# Patient Record
Sex: Female | Born: 1975 | Race: Black or African American | Hispanic: No | Marital: Single | Smoking: Former smoker
Health system: Southern US, Community
[De-identification: ages and names within clinical notes are randomized; demographics above are authoritative.]

## PROBLEM LIST (undated history)

## (undated) DIAGNOSIS — F329 Major depressive disorder, single episode, unspecified: Secondary | ICD-10-CM

## (undated) DIAGNOSIS — F32A Depression, unspecified: Secondary | ICD-10-CM

## (undated) DIAGNOSIS — F315 Bipolar disorder, current episode depressed, severe, with psychotic features: Secondary | ICD-10-CM

## (undated) DIAGNOSIS — F319 Bipolar disorder, unspecified: Secondary | ICD-10-CM

## (undated) DIAGNOSIS — F419 Anxiety disorder, unspecified: Secondary | ICD-10-CM

## (undated) HISTORY — PX: OTHER SURGICAL HISTORY: SHX169

## (undated) HISTORY — DX: Anxiety disorder, unspecified: F41.9

## (undated) HISTORY — DX: Bipolar disorder, current episode depressed, severe, with psychotic features: F31.5

---

## 2009-01-31 ENCOUNTER — Inpatient Hospital Stay (HOSPITAL_COMMUNITY): Admission: AD | Admit: 2009-01-31 | Discharge: 2009-01-31 | Payer: Self-pay | Admitting: Obstetrics

## 2009-04-03 ENCOUNTER — Ambulatory Visit (HOSPITAL_COMMUNITY): Admission: RE | Admit: 2009-04-03 | Discharge: 2009-04-03 | Payer: Self-pay | Admitting: Obstetrics

## 2009-07-11 ENCOUNTER — Ambulatory Visit (HOSPITAL_COMMUNITY): Admission: RE | Admit: 2009-07-11 | Discharge: 2009-07-11 | Payer: Self-pay | Admitting: Obstetrics

## 2009-08-27 ENCOUNTER — Inpatient Hospital Stay (HOSPITAL_COMMUNITY): Admission: RE | Admit: 2009-08-27 | Discharge: 2009-08-30 | Payer: Self-pay | Admitting: Obstetrics

## 2010-07-22 LAB — CBC
HCT: 34 % — ABNORMAL LOW (ref 36.0–46.0)
HCT: 35.8 % — ABNORMAL LOW (ref 36.0–46.0)
Hemoglobin: 11.2 g/dL — ABNORMAL LOW (ref 12.0–15.0)
Hemoglobin: 12 g/dL (ref 12.0–15.0)
MCHC: 33.3 g/dL (ref 30.0–36.0)
MCHC: 33.6 g/dL (ref 30.0–36.0)
MCV: 85.8 fL (ref 78.0–100.0)
MCV: 86.3 fL (ref 78.0–100.0)
Platelets: 263 10*3/uL (ref 150–400)
RBC: 3.91 MIL/uL (ref 3.87–5.11)
RBC: 3.95 MIL/uL (ref 3.87–5.11)
RBC: 4.17 MIL/uL (ref 3.87–5.11)
RDW: 15.3 % (ref 11.5–15.5)
WBC: 12.1 10*3/uL — ABNORMAL HIGH (ref 4.0–10.5)

## 2010-07-22 LAB — COMPREHENSIVE METABOLIC PANEL
ALT: 19 U/L (ref 0–35)
CO2: 19 mEq/L (ref 19–32)
Calcium: 8.8 mg/dL (ref 8.4–10.5)
GFR calc Af Amer: >60 mL/min (ref >60)
GFR calc non Af Amer: >60 mL/min (ref >60)
Sodium: 134 mEq/L — ABNORMAL LOW (ref 135–145)
Total Protein: 5.7 g/dL — ABNORMAL LOW (ref 6.0–8.3)

## 2010-07-22 LAB — LACTATE DEHYDROGENASE: LDH: 170 U/L (ref 94–250)

## 2010-07-22 LAB — URIC ACID: Uric Acid, Serum: 5.9 mg/dL (ref 2.4–7.0)

## 2010-08-08 LAB — URINALYSIS, ROUTINE W REFLEX MICROSCOPIC
Specific Gravity, Urine: 1.02 (ref 1.005–1.030)
Urobilinogen, UA: 0.2 mg/dL (ref 0.0–1.0)

## 2010-08-08 LAB — WET PREP, GENITAL
Clue Cells Wet Prep HPF POC: NONE SEEN
Trich, Wet Prep: NONE SEEN
Yeast Wet Prep HPF POC: NONE SEEN

## 2010-08-08 LAB — HCG, QUANTITATIVE, PREGNANCY: hCG, Beta Chain, Quant, S: 74665 m[IU]/mL — ABNORMAL HIGH (ref <5)

## 2010-08-08 LAB — CBC
HCT: 33.2 % — ABNORMAL LOW (ref 36.0–46.0)
Hemoglobin: 11.3 g/dL — ABNORMAL LOW (ref 12.0–15.0)
RDW: 16.5 % — ABNORMAL HIGH (ref 11.5–15.5)

## 2010-08-08 LAB — GC/CHLAMYDIA PROBE AMP, GENITAL
Chlamydia, DNA Probe: NEGATIVE
GC Probe Amp, Genital: NEGATIVE

## 2010-08-08 LAB — POCT PREGNANCY, URINE: Preg Test, Ur: POSITIVE

## 2010-08-08 LAB — URINE MICROSCOPIC-ADD ON

## 2010-08-08 LAB — ABO/RH: ABO/RH(D): O POS

## 2010-09-18 ENCOUNTER — Emergency Department (HOSPITAL_COMMUNITY)
Admission: EM | Admit: 2010-09-18 | Discharge: 2010-09-18 | Disposition: A | Discharge status: Home / Self Care | Payer: Medicare Other | Attending: Emergency Medicine | Admitting: Emergency Medicine

## 2010-09-18 DIAGNOSIS — R221 Localized swelling, mass and lump, neck: Secondary | ICD-10-CM | POA: Insufficient documentation

## 2010-09-18 DIAGNOSIS — R21 Rash and other nonspecific skin eruption: Secondary | ICD-10-CM | POA: Insufficient documentation

## 2010-09-18 DIAGNOSIS — R51 Headache: Secondary | ICD-10-CM | POA: Insufficient documentation

## 2010-09-18 DIAGNOSIS — R220 Localized swelling, mass and lump, head: Secondary | ICD-10-CM | POA: Insufficient documentation

## 2010-09-18 DIAGNOSIS — Z79899 Other long term (current) drug therapy: Secondary | ICD-10-CM | POA: Insufficient documentation

## 2011-11-12 ENCOUNTER — Emergency Department (INDEPENDENT_AMBULATORY_CARE_PROVIDER_SITE_OTHER): Payer: Medicare Other

## 2011-11-12 ENCOUNTER — Emergency Department (INDEPENDENT_AMBULATORY_CARE_PROVIDER_SITE_OTHER)
Admission: EM | Admit: 2011-11-12 | Discharge: 2011-11-12 | Disposition: A | Discharge status: Home / Self Care | Payer: Medicare Other | Source: Home / Self Care | Attending: Family Medicine | Admitting: Family Medicine

## 2011-11-12 ENCOUNTER — Encounter (HOSPITAL_COMMUNITY): Payer: Self-pay | Admitting: Emergency Medicine

## 2011-11-12 DIAGNOSIS — J40 Bronchitis, not specified as acute or chronic: Secondary | ICD-10-CM

## 2011-11-12 MED ORDER — AZITHROMYCIN 250 MG PO TABS: 250.0000 mg | ORAL_TABLET | Freq: Every day | ORAL | Status: AC

## 2011-11-12 MED ORDER — GUAIFENESIN-CODEINE 100-10 MG/5ML PO SYRP: ORAL_SOLUTION | ORAL | Status: DC

## 2011-11-12 NOTE — ED Notes (Signed)
Here with progressive productive cough with yellow/green mucous and slight sore throat and chest congestion that started x1 mnth ago unrelieved by otc meds and humidifier.denies fever,chills,n,v

## 2011-11-12 NOTE — ED Provider Notes (Signed)
History     CSN: [id number]  Arrival date & time 11/12/11  1322   First MD Initiated Contact with Patient 11/12/11 1347      Chief Complaint  Patient presents with  . URI    (Consider location/radiation/quality/duration/timing/severity/associated sxs/prior treatment) HPI Comments: The patient reports having a dry cough for over a months. States initially has some cold symptoms. No fever. Most of symptoms cleared but cough is persistent. Some post nasal drainage. No tx pta. Denies hx of asthma or lung problems. No shortness of breath.   The history is provided by the patient.    History reviewed. No pertinent past medical history.  History reviewed. No pertinent past surgical history.  History reviewed. No pertinent family history.  History  Substance Use Topics  . Smoking status: Never Smoker   . Smokeless tobacco: Not on file  . Alcohol Use: Yes    OB History    Grav Para Term Preterm Abortions TAB SAB Ect Mult Living                  Review of Systems  Constitutional: Negative.   HENT: Positive for postnasal drip.   Respiratory: Positive for cough. Negative for shortness of breath and wheezing.   Cardiovascular: Negative.   Gastrointestinal: Negative.   Genitourinary: Negative.   Musculoskeletal: Negative.     Allergies  Review of patient's allergies indicates no known allergies.  Home Medications   Current Outpatient Rx  Name Route Sig Dispense Refill  . LEVONORGEST-ETH ESTRAD 91-DAY 0.15-0.03 MG PO TABS Oral Take 1 tablet by mouth daily.    . SERTRALINE HCL 100 MG PO TABS Oral Take 100 mg by mouth daily.    . TRAZODONE HCL 50 MG PO TABS Oral Take 50 mg by mouth at bedtime.    . AZITHROMYCIN 250 MG PO TABS Oral Take 1 tablet (250 mg total) by mouth daily. Take first 2 tablets together, then 1 every day until finished. 6 tablet 0  . GUAIFENESIN-CODEINE 100-10 MG/5ML PO SYRP  1-2 tsp po q 6 hrs prn cough 120 mL 0    BP 130/77  Pulse 86  Temp 98.6 F  (37 C) (Oral)  Resp 18  SpO2 98%  LMP 09/16/2011  Physical Exam  Nursing note and vitals reviewed. Constitutional: She appears well-developed and well-nourished. No distress.  HENT:  Head: Normocephalic and atraumatic.  Mouth/Throat: Oropharynx is clear and moist.  Neck: Normal range of motion. Neck supple. No thyromegaly present.  Cardiovascular: Normal rate, regular rhythm and normal heart sounds.   Pulmonary/Chest: Effort normal and breath sounds normal.       Congested cough  Lymphadenopathy:    She has no cervical adenopathy.  Skin: Skin is warm and dry.    ED Course  Procedures (including critical care time)  Labs Reviewed - No data to display Dg Chest 2 View  11/12/2011  *RADIOLOGY REPORT*  Clinical Data: Productive cough, congestion  CHEST - 2 VIEW  Comparison: None  Findings: The lungs are clear.  Mediastinal contours appear normal. There is mild peribronchial thickening which may indicate bronchitis.  The heart is within normal limits in size.  No bony abnormality is seen.  IMPRESSION: No pneumonia.  Peribronchial thickening may indicate bronchitis.  Original Report Authenticated By: Juline Patch, M.D.     1. Bronchitis       MDM          Randa Spike, MD 11/12/11 [phone]

## 2011-11-23 ENCOUNTER — Emergency Department (HOSPITAL_COMMUNITY)
Admission: EM | Admit: 2011-11-23 | Discharge: 2011-11-23 | Discharge status: Absent without leave | Payer: Medicare Other | Source: Home / Self Care

## 2011-11-24 ENCOUNTER — Emergency Department (INDEPENDENT_AMBULATORY_CARE_PROVIDER_SITE_OTHER)
Admission: EM | Admit: 2011-11-24 | Discharge: 2011-11-24 | Disposition: A | Discharge status: Home / Self Care | Payer: Medicare Other | Source: Home / Self Care

## 2011-11-24 ENCOUNTER — Encounter (HOSPITAL_COMMUNITY): Payer: Self-pay

## 2011-11-24 DIAGNOSIS — R05 Cough: Secondary | ICD-10-CM

## 2011-11-24 DIAGNOSIS — R0602 Shortness of breath: Secondary | ICD-10-CM

## 2011-11-24 DIAGNOSIS — J40 Bronchitis, not specified as acute or chronic: Secondary | ICD-10-CM

## 2011-11-24 MED ORDER — CETIRIZINE HCL 10 MG PO TABS: 10.0000 mg | ORAL_TABLET | Freq: Every day | ORAL | Status: DC

## 2011-11-24 MED ORDER — ALBUTEROL SULFATE (5 MG/ML) 0.5% IN NEBU
2.5000 mg | INHALATION_SOLUTION | Freq: Once | RESPIRATORY_TRACT | Status: AC
  Administered 2011-11-24: 2.5 mg via RESPIRATORY_TRACT

## 2011-11-24 MED ORDER — ALBUTEROL SULFATE (5 MG/ML) 0.5% IN NEBU
INHALATION_SOLUTION | RESPIRATORY_TRACT | Status: AC
  Filled 2011-11-24: qty 1

## 2011-11-24 MED ORDER — ALBUTEROL SULFATE HFA 108 (90 BASE) MCG/ACT IN AERS: 2.0000 | INHALATION_SPRAY | RESPIRATORY_TRACT | Status: DC | PRN

## 2011-11-24 MED ORDER — IPRATROPIUM BROMIDE 0.02 % IN SOLN
0.5000 mg | Freq: Once | RESPIRATORY_TRACT | Status: AC
  Administered 2011-11-24: 0.5 mg via RESPIRATORY_TRACT

## 2011-11-24 MED ORDER — METHYLPREDNISOLONE SODIUM SUCC 125 MG IJ SOLR
125.0000 mg | Freq: Once | INTRAMUSCULAR | Status: AC
  Administered 2011-11-24: 125 mg via INTRAMUSCULAR

## 2011-11-24 MED ORDER — METHYLPREDNISOLONE SODIUM SUCC 125 MG IJ SOLR
INTRAMUSCULAR | Status: AC
  Filled 2011-11-24: qty 2

## 2011-11-24 MED ORDER — PREDNISONE 50 MG PO TABS: ORAL_TABLET | ORAL | Status: AC

## 2011-11-24 NOTE — ED Provider Notes (Signed)
History     CSN: [id number]  Arrival date & time 11/24/11  1105   None     Chief Complaint  Patient presents with  . Cough    (Consider location/radiation/quality/duration/timing/severity/associated sxs/prior treatment) Patient is a 36 y.o. female presenting with cough. The history is provided by the patient.  Cough  Tami Miller is a 36 y.o. female who complains of intermittent cough for 2 months days. Cough started off as dry then but reports now productive of thick yellow/green phlegm. Cough associated with postnasal drip, shortness of breath and aggravated by deep breath.   She denies a history of asthma or other lung past medical history. Patient does not smoke cigarettes or use any other tobacco products.  Reports she has taken OTC Robitussin with no relief.  Denies fever, chest pain, change in appetite or n/v/d. Seen on 11/12/11 for similar symptoms and was prescribed antibiotics, no change in symptoms.     History reviewed. No pertinent past medical history.  History reviewed. No pertinent past surgical history.  History reviewed. No pertinent family history.  History  Substance Use Topics  . Smoking status: Never Smoker   . Smokeless tobacco: Not on file  . Alcohol Use: Yes    OB History    Grav Para Term Preterm Abortions TAB SAB Ect Mult Living                  Review of Systems  Respiratory: Positive for cough.   All other systems reviewed and are negative.    Allergies  Review of patient's allergies indicates no known allergies.  Home Medications   Current Outpatient Rx  Name Route Sig Dispense Refill  . GUAIFENESIN-CODEINE 100-10 MG/5ML PO SYRP  1-2 tsp po q 6 hrs prn cough 120 mL 0  . LEVONORGEST-ETH ESTRAD 91-DAY 0.15-0.03 MG PO TABS Oral Take 1 tablet by mouth daily.    . SERTRALINE HCL 100 MG PO TABS Oral Take 100 mg by mouth daily.    . TRAZODONE HCL 50 MG PO TABS Oral Take 50 mg by mouth at bedtime.      BP 120/79  Pulse 89  Temp  98.7 F (37.1 C) (Oral)  Resp 18  SpO2 100%  LMP 09/16/2011  Physical Exam  Nursing note and vitals reviewed. Constitutional: She is oriented to person, place, and time. Vital signs are normal. She appears well-developed and well-nourished. She is active and cooperative.  HENT:  Head: Normocephalic.  Right Ear: Hearing, tympanic membrane, external ear and ear canal normal.  Left Ear: Hearing, tympanic membrane, external ear and ear canal normal.  Nose: Nose normal.  Mouth/Throat: Uvula is midline, oropharynx is clear and moist and mucous membranes are normal.  Eyes: Conjunctivae are normal. Pupils are equal, round, and reactive to light. Right eye exhibits no discharge. Left eye exhibits no discharge. No scleral icterus.  Neck: Trachea normal. Neck supple.  Cardiovascular: Normal rate, regular rhythm, normal heart sounds and normal pulses.   Pulmonary/Chest: Effort normal. She has decreased breath sounds in the right lower field, the left middle field and the left lower field. She has wheezes in the right lower field, the left middle field and the left lower field. She has no rhonchi. She has no rales.  Abdominal: Normal appearance and bowel sounds are normal. There is no tenderness.  Neurological: She is alert and oriented to person, place, and time. No cranial nerve deficit or sensory deficit.  Skin: Skin is warm and dry.  Psychiatric: She has a normal mood and affect. Her speech is normal and behavior is normal. Judgment and thought content normal. Cognition and memory are normal.    ED Course  Procedures (including critical care time)  Labs Reviewed - No data to display No results found.   1. Cough   2. SOB (shortness of breath)       MDM  Albuterol 10mg  /Ipratriopium neb, solumedrol 125mg  in office, diminished in bilateral lower fields post neb tx, discussed with pt plan of care, will begin on home oral steroids, SABA and antihistamine.  RTC or seek care for symptoms that  worsen or do not improve.         Johnsie Kindred, NP 11/24/11 1328

## 2011-11-24 NOTE — ED Notes (Signed)
C/o no improvement from prior visit earlier this month; c/o green secretions, productive cough

## 2011-11-24 NOTE — ED Provider Notes (Signed)
Medical screening examination/treatment/procedure(s) were performed by non-physician practitioner and as supervising physician I was immediately available for consultation/collaboration.  Leslee Home, M.D.   Reuben Likes, MD 11/24/11 [phone]

## 2011-11-29 ENCOUNTER — Other Ambulatory Visit: Payer: Self-pay | Admitting: Family Medicine

## 2012-09-30 ENCOUNTER — Encounter (HOSPITAL_COMMUNITY): Payer: Self-pay | Admitting: Emergency Medicine

## 2012-09-30 ENCOUNTER — Emergency Department (HOSPITAL_COMMUNITY)
Admission: EM | Admit: 2012-09-30 | Discharge: 2012-09-30 | Disposition: A | Discharge status: Home / Self Care | Payer: Medicare Other | Source: Home / Self Care | Attending: Family Medicine | Admitting: Family Medicine

## 2012-09-30 DIAGNOSIS — J020 Streptococcal pharyngitis: Secondary | ICD-10-CM

## 2012-09-30 DIAGNOSIS — T169XXA Foreign body in ear, unspecified ear, initial encounter: Secondary | ICD-10-CM

## 2012-09-30 DIAGNOSIS — T162XXA Foreign body in left ear, initial encounter: Secondary | ICD-10-CM

## 2012-09-30 HISTORY — DX: Depression, unspecified: F32.A

## 2012-09-30 HISTORY — DX: Major depressive disorder, single episode, unspecified: F32.9

## 2012-09-30 MED ORDER — FLUCONAZOLE 150 MG PO TABS: 150.0000 mg | ORAL_TABLET | Freq: Once | ORAL | Status: DC

## 2012-09-30 MED ORDER — CEFDINIR 300 MG PO CAPS: 300.0000 mg | ORAL_CAPSULE | Freq: Two times a day (BID) | ORAL | Status: DC

## 2012-09-30 NOTE — ED Notes (Deleted)
Pt is here for a persistent muscle strain... Seen here on 09/30/12 for same reason States that the pain is gradually getting better but feels like he can't go back to work today Works at [employer]... He is alert and oriented w/no signs of acute distress.

## 2012-09-30 NOTE — ED Notes (Signed)
Pt is here for ST onset 2 days... Sx include a dry cough Denies: f/v/n/d.Marland Kitchen She is alert and oriented w/no signs of acute distress.

## 2012-09-30 NOTE — ED Provider Notes (Signed)
History     CSN: [id number]  Arrival date & time 09/30/12  1011   First MD Initiated Contact with Patient 09/30/12 1033      Chief Complaint  Patient presents with  . Sore Throat    (Consider location/radiation/quality/duration/timing/severity/associated sxs/prior treatment) Patient is a 37 y.o. female presenting with pharyngitis.  Sore Throat This is a new problem. The current episode started 2 days ago (son with strep throat.). The problem has not changed since onset.The symptoms are aggravated by swallowing.    Past Medical History  Diagnosis Date  . Depression     History reviewed. No pertinent past surgical history.  No family history on file.  History  Substance Use Topics  . Smoking status: Never Smoker   . Smokeless tobacco: Not on file  . Alcohol Use: Yes    OB History   Grav Para Term Preterm Abortions TAB SAB Ect Mult Living                  Review of Systems  Constitutional: Negative.   HENT: Positive for sore throat, rhinorrhea and postnasal drip.   Respiratory: Positive for cough.     Allergies  Review of patient's allergies indicates no known allergies.  Home Medications   Current Outpatient Rx  Name  Route  Sig  Dispense  Refill  . albuterol (PROVENTIL HFA;VENTOLIN HFA) 108 (90 BASE) MCG/ACT inhaler   Inhalation   Inhale 2 puffs into the lungs every 4 (four) hours as needed for wheezing or shortness of breath.   1 Inhaler   2   . cefdinir (OMNICEF) 300 MG capsule   Oral   Take 1 capsule (300 mg total) by mouth 2 (two) times daily.   20 capsule   0   . cetirizine (ZYRTEC) 10 MG tablet   Oral   Take 1 tablet (10 mg total) by mouth daily.   30 tablet   1   . fluconazole (DIFLUCAN) 150 MG tablet   Oral   Take 1 tablet (150 mg total) by mouth once.   1 tablet   1   . guaiFENesin-codeine (ROBITUSSIN AC) 100-10 MG/5ML syrup      1-2 tsp po q 6 hrs prn cough   120 mL   0   . levonorgestrel-ethinyl estradiol  (SEASONALE,INTROVALE,JOLESSA) 0.15-0.03 MG tablet   Oral   Take 1 tablet by mouth daily.         . sertraline (ZOLOFT) 100 MG tablet   Oral   Take 100 mg by mouth daily.         . traZODone (DESYREL) 50 MG tablet   Oral   Take 50 mg by mouth at bedtime.           BP 119/76  Pulse 74  Temp(Src) 97.8 F (36.6 C) (Oral)  Resp 22  SpO2 97%  LMP 09/29/2012  Physical Exam  Nursing note and vitals reviewed. Constitutional: She is oriented to person, place, and time. She appears well-developed and well-nourished.  HENT:  Head: Normocephalic.  Right Ear: External ear normal.  Mouth/Throat: Oropharynx is clear and moist. No oropharyngeal exudate.  Clear fb in left canal.  Eyes: Pupils are equal, round, and reactive to light.  Neck: Normal range of motion. Neck supple.  Pulmonary/Chest: Breath sounds normal.  Lymphadenopathy:    She has no cervical adenopathy.  Neurological: She is alert and oriented to person, place, and time.  Skin: Skin is warm and dry.    ED Course  Procedures (including critical care time)  Labs Reviewed - No data to display No results found.   1. Strep sore throat   2. Foreign body in ear, left, initial encounter       MDM  Left ear irrigation plastic bead fb removed, canal clear after irrig., tm nl.       Linna Hoff, MD 09/30/12 [phone]

## 2013-04-26 DIAGNOSIS — R0789 Other chest pain: Secondary | ICD-10-CM | POA: Insufficient documentation

## 2013-04-26 DIAGNOSIS — Z79899 Other long term (current) drug therapy: Secondary | ICD-10-CM | POA: Insufficient documentation

## 2013-04-26 DIAGNOSIS — F329 Major depressive disorder, single episode, unspecified: Secondary | ICD-10-CM | POA: Insufficient documentation

## 2013-04-26 DIAGNOSIS — R609 Edema, unspecified: Secondary | ICD-10-CM | POA: Insufficient documentation

## 2013-04-26 DIAGNOSIS — F3289 Other specified depressive episodes: Secondary | ICD-10-CM | POA: Insufficient documentation

## 2013-04-26 DIAGNOSIS — M549 Dorsalgia, unspecified: Secondary | ICD-10-CM | POA: Insufficient documentation

## 2013-04-27 ENCOUNTER — Emergency Department (HOSPITAL_COMMUNITY)
Admission: EM | Admit: 2013-04-27 | Discharge: 2013-04-27 | Disposition: A | Discharge status: Home / Self Care | Payer: Medicare Other | Attending: Emergency Medicine | Admitting: Emergency Medicine

## 2013-04-27 ENCOUNTER — Encounter (HOSPITAL_COMMUNITY): Payer: Self-pay | Admitting: Emergency Medicine

## 2013-04-27 ENCOUNTER — Emergency Department (HOSPITAL_COMMUNITY): Payer: Medicare Other

## 2013-04-27 DIAGNOSIS — R079 Chest pain, unspecified: Secondary | ICD-10-CM

## 2013-04-27 LAB — COMPREHENSIVE METABOLIC PANEL
ALT: 15 U/L (ref 0–35)
AST: 20 U/L (ref 0–37)
Albumin: 3.6 g/dL (ref 3.5–5.2)
Alkaline Phosphatase: 77 U/L (ref 39–117)
CO2: 26 mEq/L (ref 19–32)
Chloride: 102 mEq/L (ref 96–112)
Creatinine, Ser: 0.7 mg/dL (ref 0.50–1.10)
GFR calc non Af Amer: >90 mL/min (ref >90)
Potassium: 3.7 mEq/L (ref 3.5–5.1)
Sodium: 136 mEq/L (ref 135–145)
Total Bilirubin: 0.3 mg/dL (ref 0.3–1.2)

## 2013-04-27 LAB — CBC WITH DIFFERENTIAL/PLATELET
Basophils Absolute: 0 10*3/uL (ref 0.0–0.1)
HCT: 36.8 % (ref 36.0–46.0)
Hemoglobin: 12.3 g/dL (ref 12.0–15.0)
Lymphocytes Relative: 32 % (ref 12–46)
Monocytes Absolute: 0.7 10*3/uL (ref 0.1–1.0)
Neutro Abs: 3.7 10*3/uL (ref 1.7–7.7)
Neutrophils Relative %: 55 % (ref 43–77)
RDW: 14.4 % (ref 11.5–15.5)
WBC: 6.7 10*3/uL (ref 4.0–10.5)

## 2013-04-27 MED ORDER — DIAZEPAM 5 MG/ML IJ SOLN
5.0000 mg | Freq: Once | INTRAMUSCULAR | Status: AC
  Administered 2013-04-27: 5 mg via INTRAVENOUS
  Filled 2013-04-27: qty 2

## 2013-04-27 MED ORDER — SODIUM CHLORIDE 0.9 % IV SOLN
Freq: Once | INTRAVENOUS | Status: AC
  Administered 2013-04-27: 02:00:00 via INTRAVENOUS

## 2013-04-27 MED ORDER — MORPHINE SULFATE 4 MG/ML IJ SOLN
4.0000 mg | Freq: Once | INTRAMUSCULAR | Status: AC
  Administered 2013-04-27: 4 mg via INTRAVENOUS
  Filled 2013-04-27: qty 1

## 2013-04-27 MED ORDER — ONDANSETRON HCL 4 MG/2ML IJ SOLN
4.0000 mg | Freq: Once | INTRAMUSCULAR | Status: AC
  Administered 2013-04-27: 4 mg via INTRAVENOUS
  Filled 2013-04-27: qty 2

## 2013-04-27 MED ORDER — DIAZEPAM 5 MG PO TABS: 5.0000 mg | ORAL_TABLET | Freq: Two times a day (BID) | ORAL | Status: DC

## 2013-04-27 MED ORDER — NAPROXEN 375 MG PO TABS: 375.0000 mg | ORAL_TABLET | Freq: Two times a day (BID) | ORAL | Status: DC

## 2013-04-27 NOTE — ED Provider Notes (Signed)
Medical screening examination/treatment/procedure(s) were performed by non-physician practitioner and as supervising physician I was immediately available for consultation/collaboration.  EKG Interpretation   None        Date: 04/27/2013  Rate: 71  Rhythm: normal sinus rhythm  QRS Axis: normal  Intervals: normal  ST/T Wave abnormalities: normal  Conduction Disutrbances:none  Narrative Interpretation:   Old EKG Reviewed: none available    Olivia Mackie, MD 04/27/13 [phone]

## 2013-04-27 NOTE — ED Notes (Signed)
Gail NP at bedside 

## 2013-04-27 NOTE — ED Notes (Signed)
Pt reports generalized sharp/achy CP that started Sunday, radiating to left side of back. Pt reports increased pain with inspiration. Denies N/V/D/SOB. Lungs sounds clear.

## 2013-04-27 NOTE — ED Provider Notes (Signed)
CSN: [id number]     Arrival date & time 04/26/13  2339 History   First MD Initiated Contact with Patient 04/27/13 0153     Chief Complaint  Patient presents with  . Chest Pain   (Consider location/radiation/quality/duration/timing/severity/associated sxs/prior Treatment) HPI Comments: Patient presents with 4 days of sharp, stabbing pain that starts under her left scapula and radiates to her substernal area, without diaphoresis, nausea, it's worse with inspiration or movement.  She has been trying to rest, without any results She does have an Implanon in place, but she is not a smoker.  She has not traveled.  She has no history of DVT.  She is not short of, breath.  She's not had any cough.  She's had no leg swelling.  No history of pulmonary embolus.  No cardiac history. 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  She denies any known trauma  Patient is a 37 y.o. female presenting with chest pain. The history is provided by the patient.  Chest Pain Pain location:  Unable to specify Pain quality: stabbing   Pain radiates to:  Precordial region Pain severity:  Severe Onset quality:  Unable to specify Duration:  4 days Timing:  Constant Progression:  Unchanged Chronicity:  New Ineffective treatments:  Rest Associated symptoms: back pain   Associated symptoms: no cough, no diaphoresis, no dizziness, no nausea, no numbness, no shortness of breath and no weakness     Past Medical History  Diagnosis Date  . Depression    History reviewed. No pertinent past surgical history. No family history on file. History  Substance Use Topics  . Smoking status: Never Smoker   . Smokeless tobacco: Not on file  . Alcohol Use: Yes   OB History   Grav Para Term Preterm Abortions TAB SAB Ect Mult Living                 Review of Systems  Constitutional: Negative for diaphoresis.  Respiratory: Negative for cough, shortness  of breath and wheezing.   Cardiovascular: Positive for chest pain. Negative for leg swelling.  Gastrointestinal: Negative for nausea.  Musculoskeletal: Positive for back pain. Negative for myalgias.  Neurological: Negative for dizziness, weakness and numbness.    Allergies  Review of patient's allergies indicates no known allergies.  Home Medications   Current Outpatient Rx  Name  Route  Sig  Dispense  Refill  . acetaminophen (TYLENOL) 500 MG tablet   Oral   Take 1,000 mg by mouth every 6 (six) hours as needed for mild pain or moderate pain.         . Multiple Vitamin (MULTIVITAMIN) tablet   Oral   Take 2 tablets by mouth daily.         . sertraline (ZOLOFT) 100 MG tablet   Oral   Take 100 mg by mouth daily.         . traZODone (DESYREL) 50 MG tablet   Oral   Take 50 mg by mouth at bedtime.         Marland Kitchen EXPIRED: albuterol (PROVENTIL HFA;VENTOLIN HFA) 108 (90 BASE) MCG/ACT inhaler   Inhalation   Inhale 2 puffs into the lungs every 4 (four) hours as needed for wheezing or shortness of breath.   1 Inhaler   2   . EXPIRED: cetirizine (ZYRTEC) 10 MG tablet   Oral   Take 1 tablet (10 mg total) by mouth daily.  30 tablet   1   . diazepam (VALIUM) 5 MG tablet   Oral   Take 1 tablet (5 mg total) by mouth 2 (two) times daily.   10 tablet   0   . naproxen (NAPROSYN) 375 MG tablet   Oral   Take 1 tablet (375 mg total) by mouth 2 (two) times daily with a meal.   30 tablet   0    BP 110/73  Pulse 81  Temp(Src) 97.7 F (36.5 C) (Oral)  Resp 16  SpO2 100%  LMP 02/25/2013 Physical Exam  Nursing note and vitals reviewed. Constitutional: She is oriented to person, place, and time. She appears well-developed and well-nourished.  HENT:  Head: Normocephalic.  Mouth/Throat: Oropharynx is clear and moist.  Eyes: Pupils are equal, round, and reactive to light.  Cardiovascular: Normal rate.   Pulmonary/Chest: Effort normal and breath sounds normal.   She exhibits  tenderness.  Abdominal: Soft. Bowel sounds are normal.  Musculoskeletal: Normal range of motion. She exhibits edema.  Neurological: She is alert and oriented to person, place, and time.  Skin: Skin is warm. No rash noted. No pallor.    ED Course  Procedures (including critical care time) Labs Review Labs Reviewed  CBC WITH DIFFERENTIAL  COMPREHENSIVE METABOLIC PANEL  LIPASE, BLOOD  D-DIMER, QUANTITATIVE  POCT I-STAT TROPONIN I   Imaging Review Dg Chest 2 View  04/27/2013   CLINICAL DATA:  Left-sided back and chest pain.  EXAM: CHEST  2 VIEW  COMPARISON:  Chest radiograph performed 11/12/2011  FINDINGS: The lungs are well-aerated. Mild vascular congestion is noted. There is no evidence of focal opacification, pleural effusion or pneumothorax.  The heart is normal in size; the mediastinal contour is within normal limits. No acute osseous abnormalities are seen.  IMPRESSION: Mild vascular congestion noted; lungs remain grossly clear.   Electronically Signed   By: Roanna Raider M.D.   On: 04/27/2013 04:21    EKG Interpretation   None       MDM   1. Chest pain at rest     Patient's EKG, and troponin are normal.  She has a negative d-dimer, chest x-ray is normal.  Patient did receive significant relief from Valium, and morphine.  She will be discharged him with by mouth Valium, and Naprosyn to take on a regular basis Do not feel this is acute coronary syndrome, nor pulmonary emboli    Arman Filter, NP 04/27/13 [phone]

## 2013-06-30 ENCOUNTER — Other Ambulatory Visit: Payer: Self-pay | Admitting: Family

## 2013-06-30 DIAGNOSIS — T8339XA Other mechanical complication of intrauterine contraceptive device, initial encounter: Secondary | ICD-10-CM

## 2013-07-18 ENCOUNTER — Ambulatory Visit
Admission: RE | Admit: 2013-07-18 | Discharge: 2013-07-18 | Disposition: A | Discharge status: Home / Self Care | Payer: Medicare Other | Source: Ambulatory Visit | Attending: Family | Admitting: Family

## 2013-07-18 DIAGNOSIS — T8339XA Other mechanical complication of intrauterine contraceptive device, initial encounter: Secondary | ICD-10-CM

## 2016-02-13 ENCOUNTER — Other Ambulatory Visit: Payer: Self-pay | Admitting: Specialist

## 2016-02-13 DIAGNOSIS — R928 Other abnormal and inconclusive findings on diagnostic imaging of breast: Secondary | ICD-10-CM

## 2016-02-18 ENCOUNTER — Encounter (HOSPITAL_COMMUNITY): Payer: Self-pay | Admitting: *Deleted

## 2016-02-18 ENCOUNTER — Emergency Department (HOSPITAL_COMMUNITY)
Admission: EM | Admit: 2016-02-18 | Discharge: 2016-02-19 | Disposition: A | Discharge status: Home / Self Care | Payer: Medicaid Other | Attending: Emergency Medicine | Admitting: Emergency Medicine

## 2016-02-18 DIAGNOSIS — Z5181 Encounter for therapeutic drug level monitoring: Secondary | ICD-10-CM | POA: Insufficient documentation

## 2016-02-18 DIAGNOSIS — F17200 Nicotine dependence, unspecified, uncomplicated: Secondary | ICD-10-CM | POA: Insufficient documentation

## 2016-02-18 DIAGNOSIS — F333 Major depressive disorder, recurrent, severe with psychotic symptoms: Secondary | ICD-10-CM | POA: Insufficient documentation

## 2016-02-18 DIAGNOSIS — R45851 Suicidal ideations: Secondary | ICD-10-CM

## 2016-02-18 DIAGNOSIS — F1010 Alcohol abuse, uncomplicated: Secondary | ICD-10-CM | POA: Insufficient documentation

## 2016-02-18 DIAGNOSIS — F1210 Cannabis abuse, uncomplicated: Secondary | ICD-10-CM | POA: Insufficient documentation

## 2016-02-18 HISTORY — DX: Bipolar disorder, unspecified: F31.9

## 2016-02-18 LAB — CBC
HCT: 40.4 % (ref 36.0–46.0)
HEMOGLOBIN: 14.1 g/dL (ref 12.0–15.0)
MCH: 31.3 pg (ref 26.0–34.0)
MCHC: 34.9 g/dL (ref 30.0–36.0)
MCV: 89.6 fL (ref 78.0–100.0)
PLATELETS: 273 10*3/uL (ref 150–400)
RBC: 4.51 MIL/uL (ref 3.87–5.11)
RDW: 13.6 % (ref 11.5–15.5)
WBC: 7.3 10*3/uL (ref 4.0–10.5)

## 2016-02-18 LAB — COMPREHENSIVE METABOLIC PANEL
ALK PHOS: 62 U/L (ref 38–126)
ALT: 14 U/L (ref 14–54)
AST: 21 U/L (ref 15–41)
Albumin: 4.3 g/dL (ref 3.5–5.0)
Anion gap: 7 (ref 5–15)
BILIRUBIN TOTAL: 0.3 mg/dL (ref 0.3–1.2)
BUN: 11 mg/dL (ref 6–20)
CALCIUM: 8.9 mg/dL (ref 8.9–10.3)
CHLORIDE: 106 mmol/L (ref 101–111)
CO2: 27 mmol/L (ref 22–32)
CREATININE: 0.77 mg/dL (ref 0.44–1.00)
GFR calc Af Amer: >60 mL/min (ref >60)
Glucose, Bld: 94 mg/dL (ref 65–99)
Potassium: 3.4 mmol/L — ABNORMAL LOW (ref 3.5–5.1)
Sodium: 140 mmol/L (ref 135–145)
Total Protein: 7.7 g/dL (ref 6.5–8.1)

## 2016-02-18 LAB — RAPID URINE DRUG SCREEN, HOSP PERFORMED
AMPHETAMINES: NOT DETECTED
Barbiturates: NOT DETECTED
Benzodiazepines: NOT DETECTED
Cocaine: NOT DETECTED
Opiates: NOT DETECTED
TETRAHYDROCANNABINOL: POSITIVE — AB

## 2016-02-18 LAB — SALICYLATE LEVEL: Salicylate Lvl: <7 mg/dL (ref 2.8–30.0)

## 2016-02-18 LAB — I-STAT BETA HCG BLOOD, ED (MC, WL, AP ONLY)

## 2016-02-18 LAB — ETHANOL: ALCOHOL ETHYL (B): 8 mg/dL — AB (ref <5)

## 2016-02-18 LAB — ACETAMINOPHEN LEVEL: Acetaminophen (Tylenol), Serum: <10 ug/mL — ABNORMAL LOW (ref 10–30)

## 2016-02-18 MED ORDER — ALUM & MAG HYDROXIDE-SIMETH 200-200-20 MG/5ML PO SUSP: 30.0000 mL | ORAL | Status: DC | PRN

## 2016-02-18 MED ORDER — LURASIDONE HCL 20 MG PO TABS
60.0000 mg | ORAL_TABLET | Freq: Every day | ORAL | Status: DC
  Administered 2016-02-18: 60 mg via ORAL
  Filled 2016-02-18: qty 3

## 2016-02-18 MED ORDER — ACETAMINOPHEN 325 MG PO TABS
650.0000 mg | ORAL_TABLET | ORAL | Status: DC | PRN
  Administered 2016-02-19: 650 mg via ORAL
  Filled 2016-02-18: qty 2

## 2016-02-18 MED ORDER — LAMOTRIGINE 100 MG PO TABS
200.0000 mg | ORAL_TABLET | Freq: Every day | ORAL | Status: DC
Start: 2016-02-18 — End: 2016-02-19
  Administered 2016-02-18: 200 mg via ORAL
  Filled 2016-02-18: qty 2

## 2016-02-18 MED ORDER — ADULT MULTIVITAMIN W/MINERALS CH
2.0000 | ORAL_TABLET | Freq: Every day | ORAL | Status: DC
  Administered 2016-02-18: 2 via ORAL
  Filled 2016-02-18: qty 2

## 2016-02-18 MED ORDER — ONDANSETRON HCL 4 MG PO TABS: 4.0000 mg | ORAL_TABLET | Freq: Three times a day (TID) | ORAL | Status: DC | PRN

## 2016-02-18 MED ORDER — VENLAFAXINE HCL 75 MG PO TABS
75.0000 mg | ORAL_TABLET | Freq: Two times a day (BID) | ORAL | Status: DC
  Administered 2016-02-18: 75 mg via ORAL
  Filled 2016-02-18 (×2): qty 1

## 2016-02-18 MED ORDER — IBUPROFEN 200 MG PO TABS: 600.0000 mg | ORAL_TABLET | Freq: Three times a day (TID) | ORAL | Status: DC | PRN

## 2016-02-18 MED ORDER — ZOLPIDEM TARTRATE 5 MG PO TABS: 5.0000 mg | ORAL_TABLET | Freq: Every evening | ORAL | Status: DC | PRN

## 2016-02-18 MED ORDER — ACETAMINOPHEN 500 MG PO TABS: 1000.0000 mg | ORAL_TABLET | Freq: Four times a day (QID) | ORAL | Status: DC | PRN

## 2016-02-18 MED ORDER — BREXPIPRAZOLE 1 MG PO TABS
1.0000 mg | ORAL_TABLET | Freq: Every day | ORAL | Status: DC
Start: 2016-02-18 — End: 2016-02-19
  Administered 2016-02-18: 1 mg via ORAL
  Filled 2016-02-18: qty 1

## 2016-02-18 NOTE — ED Notes (Signed)
Pt awakened feeling much better.  She is compliant with her meds.  Pleasant and cooperative.

## 2016-02-18 NOTE — ED Notes (Signed)
Bed: WA26 Expected date:  Expected time:  Means of arrival:  Comments: Triage 3 

## 2016-02-18 NOTE — ED Triage Notes (Addendum)
Patient presents with her mother with hx of bipolar disorder and depression.  Mother reports that patient seems to be talking to people that she cannot see and hasn't been eating.  She has made comments regarding "leaving this earth and isn't worth being here."  Mother states patient has attempted to OD on her medications, but mother took them away.  Patient is pacing in triage, sat down in the floor and is constantly vocalizing/humming.  Patient has hx of previous suicide attempts per mother.  [place] was hospitalized "years back" in [place].  Mother also believes patient drinks wine every day.    Patient is anxious in triage, but is responding to questions.

## 2016-02-18 NOTE — BH Assessment (Addendum)
Assessment Note  Tami Miller is an 40 y.o. female with history of Bipolar Affective Disorder. She presents to [facility] brought by mother. Patient reports symptom of insomnia. Sts she has not slept in several days. Patient drowsy at the time of the assessment. She reports symptoms of depression evidenced by loss of interest in usual pleasures, withdrawn, crying spells, fatigue, and hopelessness. Patient with increased vegetative symptoms consisting of staying in the bed most of the day and poor grooming. She has suicidal thoughts with plan overdose. Patient has overdosed in the past approximately 3x's. Previous triggers were related to insomnia as well. Patient is not forthcoming about her triggers stating, "Everything is a trigger". Patient has a family history of Bipolar Disorder (mother). No HI. No history of aggressive or assaultive behaviors. No legal issues. Patient admits to auditory hallucinations. She was reluctant to explain details about her auditory hallucinations stating, "They tell me bad things". She reports visual hallucinations of "black spots". Patient has a history of INPT mental health treatment 3x's in the state of [place]. She is currently seeking treatment at [facility] Care. Patient's psychiatrist is Dr. Caroll Rancher. Sts her therapist is  "Cassandra". Patient reports regular use of alcohol and THC use.    Diagnosis: Bipolar Affective Disorder; Depressive Disorder, Recurrent, Severe with psychotic features; Alcohol Use Disorder; Cannibas Use Disorder   Past Medical History:  Past Medical History:  Diagnosis Date  . Bipolar affective disorder (HCC)   . Depression     History reviewed. No pertinent surgical history.   Family History: No family history on file.  Social History:  reports that she has been smoking.  She has never used smokeless tobacco. She reports that she drinks alcohol. She reports that she does not use drugs.  Additional Social History:   Alcohol / Drug Use Pain Medications: SEE MAR Prescriptions: SEE MAR Over the Counter: SEE MAR History of alcohol / drug use?: Yes Substance #1 Name of Substance 1: Alcohol  1 - Age of First Use: "I don't know" 1 - Amount (size/oz): "I don't know" 1 - Frequency: daily  1 - Duration: on-going; "several yrs" 1 - Last Use / Amount: 02/17/2016 Substance #2 Name of Substance 2: THC 2 - Age of First Use: "I don't know" 2 - Amount (size/oz): "I don't know" 2 - Frequency: daily  2 - Duration: on-going  2 - Last Use / Amount: 02/17/2016  CIWA: CIWA-Ar BP: (!) 141/108 Pulse Rate: 94 COWS:    Allergies: No Known Allergies  Home Medications:  (Not in a hospital admission)  OB/GYN Status:  No LMP recorded. Patient is not currently having periods (Reason: IUD).  General Assessment Data [facility] of [facility]: [facility] TTS Assessment: In system Is this a Tele or Face-to-Face Assessment?: Face-to-Face Is this an Initial Assessment or a Re-assessment for this encounter?: Initial Assessment Marital status: Single Maiden name:  (n/a) Is patient pregnant?: No Pregnancy Status: No Living Arrangements: Alone Can pt return to current living arrangement?: No Admission Status: Voluntary Is patient capable of signing voluntary admission?: Yes Referral Source: Self/Family/Friend Insurance type:  [facility])     Crisis Care Plan Living Arrangements: Alone Legal Guardian: Other: (no legal guardian ) Name of Psychiatrist:  (Dr. Caroll Rancher @ [address] of Care) Name of Therapist:  Elonda Husky....something.Marland KitchenMarland KitchenI can't remember her name")  Education Status Is patient currently in school?: No Current Grade:  (n/a) Highest grade of school patient has completed:  (some college) Name of school:  (n/a) Solicitor  person:  (n/a)  Risk to self with the past 6 months Suicidal Ideation: Yes-Currently Present Has patient been a risk to self within the past 6 months prior to admission? : Yes Suicidal  Intent: Yes-Currently Present Has patient had any suicidal intent within the past 6 months prior to admission? : Yes Is patient at risk for suicide?: Yes Suicidal Plan?: Yes-Currently Present Has patient had any suicidal plan within the past 6 months prior to admission? : Yes Specify Current Suicidal Plan:  (overdose) Access to Means: Yes Specify Access to Suicidal Means:  (access to medications) What has been your use of drugs/alcohol within the last 12 months?:  (alcohol and thc ) Previous Attempts/Gestures: Yes How many times?:  (2 to 3 times in the past-overdose) Other Self Harm Risks:  (denies ) Triggers for Past Attempts: Other (Comment) (depression, anxiety, AVH's, Bipolar Disorder, "Everything") Intentional Self Injurious Behavior: None Family Suicide History: Yes (Mother-Bipolar Disorder) Recent stressful life event(s): Other (Comment) ("Everything"; patient would not provider any specific answer) Persecutory voices/beliefs?: No Depression: Yes Depression Symptoms: Feeling worthless/self pity, Feeling angry/irritable, Loss of interest in usual pleasures, Fatigue, Guilt, Isolating, Tearfulness, Insomnia, Despondent Substance abuse history and/or treatment for substance abuse?: No Suicide prevention information given to non-admitted patients: Not applicable  Risk to Others within the past 6 months Homicidal Ideation: No Does patient have any lifetime risk of violence toward others beyond the six months prior to admission? : No Thoughts of Harm to Others: No Current Homicidal Intent: No Current Homicidal Plan: No Access to Homicidal Means: No Identified Victim:  (n/a) History of harm to others?: No Assessment of Violence: None Noted Violent Behavior Description:  (patient calm and cooperative ) Does patient have access to weapons?: No Criminal Charges Pending?: No Does patient have a court date: No Is patient on probation?: No  Psychosis Hallucinations: Auditory, Visual  (Auditory- "Voices tell me to do bad things"; Visual- "Spots") Delusions: None noted  Mental Status Report Appearance/Hygiene: Disheveled Eye Contact: Good Motor Activity: Freedom of movement Speech: Logical/coherent Level of Consciousness: Alert Mood: Depressed Affect: Appropriate to circumstance Anxiety Level: None Thought Processes: Coherent, Relevant Judgement: Impaired Orientation: Person, Place, Time, Situation Obsessive Compulsive Thoughts/Behaviors: None  Cognitive Functioning Concentration: Decreased Memory: Recent Intact, Remote Intact IQ: Average Insight: Poor Impulse Control: Poor Appetite: Poor Weight Loss:  (unk) Weight Gain:  (unk) Sleep: Decreased Total Hours of Sleep:  (varies ) Vegetative Symptoms: None  ADLScreening [facility] Assessment Services) Patient's cognitive ability adequate to safely complete daily activities?: Yes Patient able to express need for assistance with ADLs?: Yes Independently performs ADLs?: Yes (appropriate for developmental age)  Prior Inpatient Therapy Prior Inpatient Therapy: No Prior Therapy Dates:  (n/a) Prior Therapy Facilty/Provider(s):  (n/a) Reason for Treatment:  (n/a)  Prior Outpatient Therapy Prior Outpatient Therapy: No Prior Therapy Dates:  (n/a) Prior Therapy Facilty/Provider(s):  (n/a) Reason for Treatment:  (n/a) Does patient have an ACCT team?: No Does patient have Intensive In-House Services?  : No Does patient have [facility] services? : No Does patient have P4CC services?: No  ADL Screening (condition at time of admission) Patient's cognitive ability adequate to safely complete daily activities?: Yes Is the patient deaf or have difficulty hearing?: No Does the patient have difficulty seeing, even when wearing glasses/contacts?: No Does the patient have difficulty concentrating, remembering, or making decisions?: No Patient able to express need for assistance with ADLs?: Yes Does the patient have difficulty  dressing or bathing?: No Independently performs ADLs?: Yes (appropriate for developmental  age) Does the patient have difficulty walking or climbing stairs?: No Weakness of Legs: None Weakness of Arms/Hands: None  Home Assistive Devices/Equipment Home Assistive Devices/Equipment: None    Abuse/Neglect Assessment (Assessment to be complete while patient is alone) Physical Abuse: Denies Verbal Abuse: Denies Sexual Abuse: Denies Exploitation of patient/patient's resources: Denies Self-Neglect: Denies Values / Beliefs Cultural Requests During Hospitalization: None Spiritual Requests During Hospitalization: None   Advance Directives (For Healthcare) Does patient have an advance directive?: No Would patient like information on creating an advanced directive?: No - patient declined information Nutrition Screen- [facility] Patient's home diet: Regular  Additional Information 1:1 In Past 12 Months?: No CIRT Risk: No Elopement Risk: No Does patient have medical clearance?: No     Disposition:  Disposition Initial Assessment Completed for this Encounter: Yes Disposition of Patient: Inpatient treatment program (Patient meets criteria for INPT treatment) Type of inpatient treatment program: Adult  On Site Evaluation by:   Reviewed with Physician:      Melynda Ripple [facility] 02/18/2016 11:04 AM

## 2016-02-18 NOTE — ED Notes (Signed)
Bed: [facility] Expected date:  Expected time:  Means of arrival:  Comments: 

## 2016-02-18 NOTE — ED Notes (Signed)
[facility].

## 2016-02-18 NOTE — ED Provider Notes (Signed)
[facility]-EMERGENCY DEPT Provider Note   CSN: [id number] Arrival date & time: 02/18/16  0820     History   Chief Complaint Chief Complaint  Patient presents with  . Suicidal    HPI Tami Miller is a 40 y.o. female.  HPI Patient presents to the emergency department with suicidal ideation.  The patient will not give me much information.  The mother is giving me full information on the patient stated she is voicing suicidal ideation, along with hallucinations and depression.  The mother states that she has had no other symptoms such as chest pain, shortness of breath, weakness, dizziness, headache, blurred vision, numbness, or syncope Past Medical History:  Diagnosis Date  . Bipolar affective disorder (HCC)   . Depression     There are no active problems to display for this patient.   History reviewed. No pertinent surgical history.  OB History    No data available       Home Medications    Prior to Admission medications   Medication Sig Start Date End Date Taking? Authorizing Provider  acetaminophen (TYLENOL) 500 MG tablet Take 1,000 mg by mouth every 6 (six) hours as needed for mild pain or moderate pain.   Yes Historical Provider, MD  Brexpiprazole (REXULTI) 1 MG TABS Take 1 mg by mouth daily.   Yes Historical Provider, MD  lamoTRIgine (LAMICTAL) 200 MG tablet Take 200 mg by mouth daily.   Yes Historical Provider, MD  Lurasidone HCl (LATUDA) 60 MG TABS Take 60 mg by mouth daily.   Yes Historical Provider, MD  Multiple Vitamin (MULTIVITAMIN) tablet Take 2 tablets by mouth daily.   Yes Historical Provider, MD  venlafaxine (EFFEXOR) 75 MG tablet Take 75 mg by mouth 2 (two) times daily.   Yes Historical Provider, MD    Family History No family history on file.  Social History Social History  Substance Use Topics  . Smoking status: Current Every Day Smoker  . Smokeless tobacco: Never Used  . Alcohol use Yes     Comment: daily     Allergies   Review of  patient's allergies indicates no known allergies.   Review of Systems Review of Systems Level V caveat applies due to psychosis  Physical Exam Updated Vital Signs BP 106/66 (BP Location: Left Arm)   Pulse 67   Temp 97.9 F (36.6 C) (Oral)   Resp 16   SpO2 100%   Physical Exam  Constitutional: She is oriented to person, place, and time. She appears well-developed and well-nourished. No distress.  HENT:  Head: Normocephalic and atraumatic.  Mouth/Throat: Oropharynx is clear and moist.  Eyes: Pupils are equal, round, and reactive to light.  Neck: Normal range of motion. Neck supple.  Cardiovascular: Normal rate, regular rhythm and normal heart sounds.  Exam reveals no gallop and no friction rub.   No murmur heard. Pulmonary/Chest: Effort normal and breath sounds normal. No respiratory distress. She has no wheezes.  Neurological: She is alert and oriented to person, place, and time. She exhibits normal muscle tone. Coordination normal.  Skin: Skin is warm and dry. No rash noted. No erythema.  Psychiatric: Thought content is delusional. She exhibits a depressed mood. She is noncommunicative. She is inattentive.  Nursing note and vitals reviewed.    ED Treatments / Results  Labs (all labs ordered are listed, but only abnormal results are displayed) Labs Reviewed  COMPREHENSIVE METABOLIC PANEL - Abnormal; Notable for the following:       Result Value  Potassium 3.4 (*)    All other components within normal limits  ETHANOL - Abnormal; Notable for the following:    Alcohol, Ethyl (B) 8 (*)    All other components within normal limits  ACETAMINOPHEN LEVEL - Abnormal; Notable for the following:    Acetaminophen (Tylenol), Serum <10 (*)    All other components within normal limits  RAPID URINE DRUG SCREEN, HOSP PERFORMED - Abnormal; Notable for the following:    Tetrahydrocannabinol POSITIVE (*)    All other components within normal limits  SALICYLATE LEVEL  CBC  I-STAT BETA  HCG BLOOD, ED ([facility], [facility], [facility] ONLY)    EKG  EKG Interpretation None       Radiology No results found.  Procedures Procedures (including critical care time)  Medications Ordered in ED Medications  acetaminophen (TYLENOL) tablet 650 mg (not administered)  ibuprofen (ADVIL,MOTRIN) tablet 600 mg (not administered)  zolpidem (AMBIEN) tablet 5 mg (not administered)  ondansetron (ZOFRAN) tablet 4 mg (not administered)  alum & mag hydroxide-simeth (MAALOX/MYLANTA) 200-200-20 MG/5ML suspension 30 mL (not administered)     Initial Impression / Assessment and Plan / ED Course  I have reviewed the triage vital signs and the nursing notes.  Pertinent labs & imaging results that were available during my care of the patient were reviewed by me and considered in my medical decision making (see chart for details).  Clinical Course    Patient will need TTS assessment for her hallucinations and suicidal ideation  Final Clinical Impressions(s) / ED Diagnoses   Final diagnoses:  None    New Prescriptions New Prescriptions   No medications on file     Charlestine NightChristopher Roby Donaway, PA-C 02/18/16 1554    Mancel BaleElliott Wentz, MD 02/19/16 [phone]

## 2016-02-18 NOTE — ED Notes (Signed)
Pt went to sleep immediately after being brought to TCU.

## 2016-02-18 NOTE — ED Notes (Signed)
Pt admitted to room 35.  She was calm and cooperative and went straight to sleep.

## 2016-02-19 ENCOUNTER — Inpatient Hospital Stay (HOSPITAL_COMMUNITY)
Admission: AD | Admit: 2016-02-19 | Discharge: 2016-02-22 | DRG: 885 | Disposition: A | Discharge status: Home / Self Care | Payer: Medicaid Other | Source: Intra-hospital | Attending: Psychiatry | Admitting: Psychiatry

## 2016-02-19 ENCOUNTER — Other Ambulatory Visit: Payer: Self-pay

## 2016-02-19 ENCOUNTER — Encounter (HOSPITAL_COMMUNITY): Payer: Self-pay

## 2016-02-19 DIAGNOSIS — Z634 Disappearance and death of family member: Secondary | ICD-10-CM | POA: Diagnosis not present

## 2016-02-19 DIAGNOSIS — Z915 Personal history of self-harm: Secondary | ICD-10-CM | POA: Diagnosis not present

## 2016-02-19 DIAGNOSIS — F1020 Alcohol dependence, uncomplicated: Secondary | ICD-10-CM | POA: Diagnosis present

## 2016-02-19 DIAGNOSIS — R45851 Suicidal ideations: Secondary | ICD-10-CM | POA: Diagnosis present

## 2016-02-19 DIAGNOSIS — R4587 Impulsiveness: Secondary | ICD-10-CM | POA: Diagnosis present

## 2016-02-19 DIAGNOSIS — Z803 Family history of malignant neoplasm of breast: Secondary | ICD-10-CM | POA: Diagnosis not present

## 2016-02-19 DIAGNOSIS — F29 Unspecified psychosis not due to a substance or known physiological condition: Secondary | ICD-10-CM | POA: Diagnosis present

## 2016-02-19 DIAGNOSIS — Y900 Blood alcohol level of less than 20 mg/100 ml: Secondary | ICD-10-CM | POA: Diagnosis present

## 2016-02-19 DIAGNOSIS — F314 Bipolar disorder, current episode depressed, severe, without psychotic features: Secondary | ICD-10-CM | POA: Diagnosis not present

## 2016-02-19 DIAGNOSIS — F4310 Post-traumatic stress disorder, unspecified: Secondary | ICD-10-CM | POA: Diagnosis present

## 2016-02-19 DIAGNOSIS — F17210 Nicotine dependence, cigarettes, uncomplicated: Secondary | ICD-10-CM | POA: Diagnosis present

## 2016-02-19 DIAGNOSIS — F1220 Cannabis dependence, uncomplicated: Secondary | ICD-10-CM | POA: Diagnosis present

## 2016-02-19 DIAGNOSIS — Z79899 Other long term (current) drug therapy: Secondary | ICD-10-CM

## 2016-02-19 DIAGNOSIS — F315 Bipolar disorder, current episode depressed, severe, with psychotic features: Secondary | ICD-10-CM | POA: Diagnosis present

## 2016-02-19 DIAGNOSIS — F410 Panic disorder [episodic paroxysmal anxiety] without agoraphobia: Secondary | ICD-10-CM | POA: Diagnosis present

## 2016-02-19 DIAGNOSIS — G4700 Insomnia, unspecified: Secondary | ICD-10-CM | POA: Diagnosis present

## 2016-02-19 DIAGNOSIS — Z818 Family history of other mental and behavioral disorders: Secondary | ICD-10-CM | POA: Diagnosis not present

## 2016-02-19 LAB — LIPID PANEL
Cholesterol: 147 mg/dL (ref 0–200)
HDL: 75 mg/dL (ref >40)
LDL Cholesterol: 56 mg/dL (ref 0–99)
Total CHOL/HDL Ratio: 2 RATIO
Triglycerides: 80 mg/dL (ref <150)
VLDL: 16 mg/dL (ref 0–40)

## 2016-02-19 LAB — TSH: TSH: 2.004 u[IU]/mL (ref 0.350–4.500)

## 2016-02-19 MED ORDER — LOPERAMIDE HCL 2 MG PO CAPS: 2.0000 mg | ORAL_CAPSULE | ORAL | Status: AC | PRN

## 2016-02-19 MED ORDER — ACETAMINOPHEN 500 MG PO TABS: 1000.0000 mg | ORAL_TABLET | Freq: Four times a day (QID) | ORAL | Status: DC | PRN

## 2016-02-19 MED ORDER — LORAZEPAM 1 MG PO TABS
1.0000 mg | ORAL_TABLET | Freq: Two times a day (BID) | ORAL | Status: AC
  Administered 2016-02-21 (×2): 1 mg via ORAL
  Filled 2016-02-19 (×2): qty 1

## 2016-02-19 MED ORDER — VITAMIN B-1 100 MG PO TABS
100.0000 mg | ORAL_TABLET | Freq: Every day | ORAL | Status: DC
  Administered 2016-02-20 – 2016-02-22 (×3): 100 mg via ORAL
  Filled 2016-02-19 (×6): qty 1

## 2016-02-19 MED ORDER — THIAMINE HCL 100 MG/ML IJ SOLN: 100.0000 mg | Freq: Once | INTRAMUSCULAR | Status: DC

## 2016-02-19 MED ORDER — LORAZEPAM 1 MG PO TABS
1.0000 mg | ORAL_TABLET | Freq: Every day | ORAL | Status: AC
  Administered 2016-02-22: 1 mg via ORAL
  Filled 2016-02-19: qty 1

## 2016-02-19 MED ORDER — MAGNESIUM HYDROXIDE 400 MG/5ML PO SUSP
30.0000 mL | Freq: Every day | ORAL | Status: DC | PRN
  Administered 2016-02-20: 30 mL via ORAL
  Filled 2016-02-19: qty 30

## 2016-02-19 MED ORDER — TRAZODONE HCL 50 MG PO TABS
50.0000 mg | ORAL_TABLET | Freq: Every evening | ORAL | Status: DC | PRN
  Filled 2016-02-19 (×2): qty 1

## 2016-02-19 MED ORDER — LURASIDONE HCL 60 MG PO TABS
60.0000 mg | ORAL_TABLET | Freq: Every day | ORAL | Status: DC
  Filled 2016-02-19: qty 1

## 2016-02-19 MED ORDER — LORAZEPAM 1 MG PO TABS
1.0000 mg | ORAL_TABLET | Freq: Three times a day (TID) | ORAL | Status: AC
  Administered 2016-02-20 (×3): 1 mg via ORAL
  Filled 2016-02-19 (×3): qty 1

## 2016-02-19 MED ORDER — LORAZEPAM 1 MG PO TABS: 1.0000 mg | ORAL_TABLET | Freq: Four times a day (QID) | ORAL | Status: AC | PRN

## 2016-02-19 MED ORDER — MIRTAZAPINE 7.5 MG PO TABS
7.5000 mg | ORAL_TABLET | Freq: Every day | ORAL | Status: DC
  Administered 2016-02-19: 7.5 mg via ORAL
  Filled 2016-02-19 (×4): qty 1

## 2016-02-19 MED ORDER — DIVALPROEX SODIUM ER 250 MG PO TB24
750.0000 mg | ORAL_TABLET | Freq: Every day | ORAL | Status: DC
  Administered 2016-02-19 – 2016-02-21 (×3): 750 mg via ORAL
  Filled 2016-02-19 (×6): qty 3

## 2016-02-19 MED ORDER — LURASIDONE HCL 60 MG PO TABS
60.0000 mg | ORAL_TABLET | Freq: Every day | ORAL | Status: DC
  Administered 2016-02-19: 60 mg via ORAL
  Filled 2016-02-19 (×2): qty 1

## 2016-02-19 MED ORDER — LAMOTRIGINE 200 MG PO TABS
200.0000 mg | ORAL_TABLET | Freq: Every day | ORAL | Status: DC
  Administered 2016-02-19: 200 mg via ORAL
  Filled 2016-02-19 (×2): qty 1
  Filled 2016-02-19: qty 2

## 2016-02-19 MED ORDER — LURASIDONE HCL 20 MG PO TABS
60.0000 mg | ORAL_TABLET | Freq: Every day | ORAL | Status: DC
  Filled 2016-02-19 (×2): qty 3

## 2016-02-19 MED ORDER — LORAZEPAM 1 MG PO TABS
1.0000 mg | ORAL_TABLET | Freq: Four times a day (QID) | ORAL | Status: AC
  Administered 2016-02-19 (×4): 1 mg via ORAL
  Filled 2016-02-19 (×4): qty 1

## 2016-02-19 MED ORDER — ENSURE ENLIVE PO LIQD: 237.0000 mL | Freq: Two times a day (BID) | ORAL | Status: DC | PRN

## 2016-02-19 MED ORDER — LAMOTRIGINE 100 MG PO TABS
100.0000 mg | ORAL_TABLET | Freq: Every day | ORAL | Status: DC
  Administered 2016-02-20: 100 mg via ORAL
  Filled 2016-02-19 (×2): qty 1

## 2016-02-19 MED ORDER — VENLAFAXINE HCL ER 37.5 MG PO CP24
37.5000 mg | ORAL_CAPSULE | Freq: Every day | ORAL | Status: DC
Start: 2016-02-21 — End: 2016-02-22
  Administered 2016-02-21 – 2016-02-22 (×2): 37.5 mg via ORAL
  Filled 2016-02-19 (×4): qty 1

## 2016-02-19 MED ORDER — LURASIDONE HCL 60 MG PO TABS: 60.0000 mg | ORAL_TABLET | Freq: Every day | ORAL | Status: DC

## 2016-02-19 MED ORDER — ONDANSETRON 4 MG PO TBDP: 4.0000 mg | ORAL_TABLET | Freq: Four times a day (QID) | ORAL | Status: AC | PRN

## 2016-02-19 MED ORDER — HYDROXYZINE HCL 25 MG PO TABS: 25.0000 mg | ORAL_TABLET | Freq: Four times a day (QID) | ORAL | Status: DC | PRN

## 2016-02-19 MED ORDER — ADULT MULTIVITAMIN W/MINERALS CH
2.0000 | ORAL_TABLET | Freq: Every day | ORAL | Status: DC
  Administered 2016-02-19 – 2016-02-22 (×4): 2 via ORAL
  Filled 2016-02-19 (×7): qty 2

## 2016-02-19 MED ORDER — VENLAFAXINE HCL 75 MG PO TABS
75.0000 mg | ORAL_TABLET | Freq: Two times a day (BID) | ORAL | Status: AC
  Administered 2016-02-19: 75 mg via ORAL
  Filled 2016-02-19: qty 1

## 2016-02-19 MED ORDER — VENLAFAXINE HCL 75 MG PO TABS
75.0000 mg | ORAL_TABLET | Freq: Three times a day (TID) | ORAL | Status: DC
  Administered 2016-02-19: 75 mg via ORAL
  Filled 2016-02-19: qty 2
  Filled 2016-02-19 (×4): qty 1

## 2016-02-19 MED ORDER — ACETAMINOPHEN 325 MG PO TABS
650.0000 mg | ORAL_TABLET | Freq: Four times a day (QID) | ORAL | Status: DC | PRN
  Administered 2016-02-19 – 2016-02-22 (×6): 650 mg via ORAL
  Filled 2016-02-19 (×6): qty 2

## 2016-02-19 MED ORDER — ALUM & MAG HYDROXIDE-SIMETH 200-200-20 MG/5ML PO SUSP: 30.0000 mL | ORAL | Status: DC | PRN

## 2016-02-19 MED ORDER — VENLAFAXINE HCL 50 MG PO TABS
50.0000 mg | ORAL_TABLET | Freq: Every day | ORAL | Status: DC
  Administered 2016-02-20: 50 mg via ORAL
  Filled 2016-02-19 (×3): qty 1

## 2016-02-19 NOTE — Tx Team (Addendum)
Initial Treatment Plan 02/19/2016 4:47 AM Gorden HarmsLillyan S Osias [id number]    PATIENT STRESSORS: Medication change or noncompliance Substance abuse   PATIENT STRENGTHS: Ability for insight Capable of independent living General fund of knowledge Motivation for treatment/growth Supportive family/friends   PATIENT IDENTIFIED PROBLEMS: Risk for suicide  depression  psychosis  "get on right meds"  "tired of being depressed"  SA           DISCHARGE CRITERIA:  Improved stabilization in mood, thinking, and/or behavior Verbal commitment to aftercare and medication compliance  PRELIMINARY DISCHARGE PLAN: Attend aftercare/continuing care group Attend 12-step recovery group  PATIENT/FAMILY INVOLVEMENT: This treatment plan has been presented to and reviewed with the patient, Gorden HarmsLillyan S Hurlbut.  The patient and family have been given the opportunity to ask questions and make suggestions.  Delos HaringPhillips, Cowen Pesqueira A, RN 02/19/2016, 4:47 AM

## 2016-02-19 NOTE — Progress Notes (Signed)
NUTRITION ASSESSMENT  Pt identified as at risk on the Malnutrition Screen Tool  INTERVENTION: 1. Supplements: Ensure Enlive po PRN, each supplement provides 350 kcal and 20 grams of protein  NUTRITION DIAGNOSIS: Unintentional weight loss related to sub-optimal intake as evidenced by pt report.   Goal: Pt to meet >/= 90% of their estimated nutrition needs.  Monitor:  PO intake  Assessment:  Pt admitted with bipolar disorder and depression. Pt's mother in ED reports pt has not been eating and suspects pt drinking wine daily. Given this report, pt would likely benefit from nutritional supplements if not eating >50% of meals. Will order PRN.  Height: Ht Readings from Last 1 Encounters:  02/19/16 5\' 7"  (1.702 m)    Weight: Wt Readings from Last 1 Encounters:  02/19/16 194 lb (88 kg)    Weight Hx: Wt Readings from Last 10 Encounters:  02/19/16 194 lb (88 kg)    BMI:  Body mass index is 30.38 kg/m. Pt meets criteria for obesity based on current BMI.  Estimated Nutritional Needs: Kcal: 25-30 kcal/kg Protein: > 1 gram protein/kg Fluid: 1 ml/kcal  Diet Order: Diet regular Room service appropriate? Yes; Fluid consistency: Thin Pt is also offered choice of unit snacks mid-morning and mid-afternoon.  Pt is eating as desired.   Lab results and medications reviewed.   Tami FrancoLindsey Sanyiah Kanzler, MS, RD, LDN Pager: [phone] After Hours Pager: [phone]

## 2016-02-19 NOTE — [facility] Group Notes (Signed)
[facility] LCSW Group Therapy  02/19/2016 1:20 PM   Type of Therapy:  Group Therapy   Participation Level:  Engaged  Participation Quality:  Attentive  Affect:  Appropriate   Cognitive:  Alert   Insight:  Engaged  Engagement in Therapy:  Improving   Modes of Intervention:  Education, Exploration, Socialization   Summary of Progress/Problems: Invited, chose not to attend.   Onalee HuaDavid from the [employer] was here to tell his story of recovery, inform patients about [employer] and play his guitar.   Tami DaubJolan Albirtha Miller 02/19/2016 1:20 PM

## 2016-02-19 NOTE — [facility] Suicide Risk Assessment (Signed)
[facility] INPATIENT:  Family/Significant Other Suicide Prevention Education  Suicide Prevention Education:  Education Completed;Tami Miller [phone] been identified by the patient as the family member/significant other with whom the patient will be residing, and identified as the person(s) who will aid the patient in the event of a mental health crisis (suicidal ideations/suicide attempt).  With written consent from the patient, the family member/significant other has been provided the following suicide prevention education, prior to the and/or following the discharge of the patient.  The suicide prevention education provided includes the following:  Suicide risk factors  Suicide prevention and interventions  National Suicide Hotline telephone number  [facility] assessment telephone number  [facility] Emergency Assistance 911  [facility] and/or Residential Mobile Crisis [facility] telephone number  Request made of family/significant other to:  Remove weapons (e.g., guns, rifles, knives), all items previously/currently identified as safety concern.    Remove drugs/medications (over-the-counter, prescriptions, illicit drugs), all items previously/currently identified as a safety concern.  The family member/significant other verbalizes understanding of the suicide prevention education information provided.  The family member/significant other agrees to remove the items of safety concern listed above.  Tami Miller 02/19/2016, 11:44 AM

## 2016-02-19 NOTE — Progress Notes (Signed)
Patient ID: Gorden HarmsLillyan S Gaskins, female   DOB: 09/24/1975, 40 y.o.   MRN: [id number] Admission Note:  D:40 yr female who presents VC in no acute distress for the treatment of SI and Depression. Pt appears flat and depressed. Pt was calm and cooperative with admission process. Pt presents with passive SI and contracts for safety upon admission. Pt denies AVH . Pt stated she has been SI x 1 month with no specific triggers. Pt stated she was just " tired of being depressed". Pt would not elaborate with the details of most of her responses due to her "being tired".   A:Skin was assessed and found to be clear of any abnormal marks apart from a scar on L antecubital space/ forearm/ wrist, bilateral knees and belly ring . PT searched and no contraband found, POC and unit policies explained and understanding verbalized. Consents obtained. Food and fluids offered, and fluids accepted.   R:Pt had no additional questions or concerns.

## 2016-02-19 NOTE — Plan of Care (Signed)
Problem: Medication: Goal: Compliance with prescribed medication regimen will improve Outcome: Progressing Pt is compliant with current medication regimen. Denies adverse drug reactions at this time.   Problem: Safety: Goal: Ability to remain free from injury will improve Outcome: Progressing Pt remains on Q 15 minutes safety checks on and off unit without gestures of self injurious behavior to report at thus far this shift.

## 2016-02-19 NOTE — [facility] Suicide Risk Assessment (Signed)
[facility]   Nursing information obtained from:    Demographic factors:    Current Mental Status:    Loss Factors:    Historical Factors:    Risk Reduction Factors:     Total Time spent with patient: 30 minutes Principal Problem: Bipolar disorder, curr episode depressed, severe, w/psychotic features (HCC) Diagnosis:   Patient Active Problem List   Diagnosis Date Noted  . Bipolar disorder, curr episode depressed, severe, w/psychotic features (HCC) [F31.5] 02/19/2016  . Bereavement [Z63.4] 02/19/2016  . PTSD (post-traumatic stress disorder) [F43.10] 02/19/2016   Subjective Data: Please see H&P.   Continued Clinical Symptoms:  Alcohol Use Disorder Identification Test Final Score (AUDIT): 15 The "Alcohol Use Disorders Identification Test", Guidelines for Use in Primary Care, Second Edition.  World Science writerHealth Organization [facility](WHO). Score between 0-7:  no or low risk or alcohol related problems. Score between 8-15:  moderate risk of alcohol related problems. Score between 16-19:  high risk of alcohol related problems. Score 20 or above:  warrants further diagnostic evaluation for alcohol dependence and treatment.   CLINICAL FACTORS:   Bipolar Disorder:   Depressive phase Alcohol/Substance Abuse/Dependencies   Musculoskeletal: Strength & Muscle Tone: within normal limits Gait & Station: normal Patient leans: N/A  Psychiatric Specialty Exam: Physical Exam  ROS  Blood pressure (!) 123/96, pulse 75, temperature 98.6 F (37 C), temperature source Oral, resp. rate 18, height 5\' 7"  (1.702 m), weight 88 kg (194 lb).Body mass index is 30.38 kg/m.   Please see H&P for mse.   COGNITIVE FEATURES THAT CONTRIBUTE TO RISK:  Closed-mindedness, Polarized thinking and Thought constriction (tunnel vision)    SUICIDE RISK:   Severe:  Frequent, intense, and enduring suicidal ideation, specific plan, no subjective intent, but some objective markers of intent (i.e.,  choice of lethal method), the method is accessible, some limited preparatory behavior, evidence of impaired self-control, severe dysphoria/symptomatology, multiple risk factors present, and few if any protective factors, particularly a lack of social support.   PLAN OF CARE: Please see H&P.   I certify that inpatient services furnished can reasonably be expected to improve the patient's condition.  Kiril Hippe, MD 02/19/2016, 11:54 AM

## 2016-02-19 NOTE — Progress Notes (Signed)
   D: Writer observed pt stating and pacing the hall prior to the start of the shift. Pt observed standing at the window of the dayroom, near the telephone and wasn't interacting with anyone. During the assessment pt informed the writer that she was waiting for her mother to come and bring toiletries. Stated she believes she gave her mom the wrong time for visitation. Writer went up front to check for property dropped off. None at the time of the writing. Pt didn't forward much information at the time of the assessment was mainly focused on property. Pt has no other questions or concerns.   A:  Support and encouragement was offered. 15 min checks continued for safety.  R: Pt remains safe.
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
## 2016-02-19 NOTE — Tx Team (Signed)
Interdisciplinary Treatment and Diagnostic Plan Update  02/19/2016 Time of Session: 11:52 AM  Tami Miller MRN: [id number]  Principal Diagnosis: Bipolar disorder, curr episode depressed, severe, w/psychotic features ([place])  Secondary Diagnoses: Active Problems:   Bipolar affective disorder, depressed, severe ([facility])   Current Medications:  Current Facility-Administered Medications  Medication Dose Route Frequency Provider Last Rate Last Dose  . acetaminophen (TYLENOL) tablet 650 mg  650 mg Oral Q6H PRN Laverle Hobby, PA-C      . alum & mag hydroxide-simeth (MAALOX/MYLANTA) 200-200-20 MG/5ML suspension 30 mL  30 mL Oral Q4H PRN Laverle Hobby, PA-C      . divalproex (DEPAKOTE ER) 24 hr tablet 750 mg  750 mg Oral QHS Saramma Eappen, MD      . feeding supplement (ENSURE ENLIVE) (ENSURE ENLIVE) liquid 237 mL  237 mL Oral BID PRN Ursula Alert, MD      . hydrOXYzine (ATARAX/VISTARIL) tablet 25 mg  25 mg Oral Q6H PRN Laverle Hobby, PA-C      . [START ON 02/20/2016] lamoTRIgine (LAMICTAL) tablet 100 mg  100 mg Oral Daily Saramma Eappen, MD      . loperamide (IMODIUM) capsule 2-4 mg  2-4 mg Oral PRN Laverle Hobby, PA-C      . LORazepam (ATIVAN) tablet 1 mg  1 mg Oral Q6H PRN Laverle Hobby, PA-C      . LORazepam (ATIVAN) tablet 1 mg  1 mg Oral QID Laverle Hobby, PA-C   1 mg at 02/19/16 7494   Followed by  . [START ON 02/20/2016] LORazepam (ATIVAN) tablet 1 mg  1 mg Oral TID Laverle Hobby, PA-C       Followed by  . [START ON 02/21/2016] LORazepam (ATIVAN) tablet 1 mg  1 mg Oral BID Laverle Hobby, PA-C       Followed by  . [START ON 02/22/2016] LORazepam (ATIVAN) tablet 1 mg  1 mg Oral Daily Spencer E Simon, PA-C      . lurasidone (LATUDA) tablet 60 mg  60 mg Oral Daily Laverle Hobby, PA-C   60 mg at 02/19/16 0920  . magnesium hydroxide (MILK OF MAGNESIA) suspension 30 mL  30 mL Oral Daily PRN Laverle Hobby, PA-C      . mirtazapine (REMERON) tablet 7.5 mg  7.5 mg Oral QHS  Saramma Eappen, MD      . multivitamin with minerals tablet 2 tablet  2 tablet Oral Daily Laverle Hobby, PA-C   2 tablet at 02/19/16 4967  . ondansetron (ZOFRAN-ODT) disintegrating tablet 4 mg  4 mg Oral Q6H PRN Laverle Hobby, PA-C      . thiamine (B-1) injection 100 mg  100 mg Intramuscular Once Laverle Hobby, PA-C      . [START ON 02/20/2016] thiamine (VITAMIN B-1) tablet 100 mg  100 mg Oral Daily Laverle Hobby, PA-C      . [START ON 02/20/2016] venlafaxine (EFFEXOR) tablet 50 mg  50 mg Oral Q breakfast Saramma Eappen, MD      . venlafaxine (EFFEXOR) tablet 75 mg  75 mg Oral BID WC Saramma Eappen, MD      . Derrill Memo ON 02/21/2016] venlafaxine XR (EFFEXOR-XR) 24 hr capsule 37.5 mg  37.5 mg Oral Q breakfast Ursula Alert, MD        PTA Medications: Prescriptions Prior to Admission  Medication Sig Dispense Refill Last Dose  . acetaminophen (TYLENOL) 500 MG tablet Take 1,000 mg by mouth every 6 (six)  hours as needed for mild pain or moderate pain.   02/19/2016 at Unknown time  . Brexpiprazole (REXULTI) 1 MG TABS Take 1 mg by mouth daily.   Past Week at Unknown time  . lamoTRIgine (LAMICTAL) 200 MG tablet Take 200 mg by mouth daily.   02/19/2016 at Unknown time  . Lurasidone HCl (LATUDA) 60 MG TABS Take 60 mg by mouth daily.   02/19/2016 at Unknown time  . Multiple Vitamin (MULTIVITAMIN) tablet Take 2 tablets by mouth daily.   02/18/2016 at Unknown time  . venlafaxine (EFFEXOR) 75 MG tablet Take 75 mg by mouth 2 (two) times daily.   02/19/2016 at Unknown time    Treatment Modalities: Medication Management, Group therapy, Case management,  1 to 1 session with clinician, Psychoeducation, Recreational therapy.   Physician Treatment Plan for Primary Diagnosis: Bipolar disorder, curr episode depressed, severe, w/psychotic features ([place]) Long Term Goal(s): Improvement in symptoms so as ready for discharge  Short Term Goals: Ability to identify and develop effective coping behaviors will  improve  Medication Management: Evaluate patient's response, side effects, and tolerance of medication regimen.  Therapeutic Interventions: 1 to 1 sessions, Unit Group sessions and Medication administration.  Evaluation of Outcomes: Not Met  Physician Treatment Plan for Secondary Diagnosis: Active Problems:   Bipolar affective disorder, depressed, severe ([place])   Long Term Goal(s): Improvement in symptoms so as ready for discharge  Short Term Goals: Ability to identify triggers associated with substance abuse/mental health issues will improve  Medication Management: Evaluate patient's response, side effects, and tolerance of medication regimen.  Therapeutic Interventions: 1 to 1 sessions, Unit Group sessions and Medication administration.  Evaluation of Outcomes: Not Met   RN Treatment Plan for Primary Diagnosis: Bipolar disorder, curr episode depressed, severe, w/psychotic features ([place]) Long Term Goal(s): Knowledge of disease and therapeutic regimen to maintain health will improve  Short Term Goals: Ability to remain free from injury will improve and Ability to disclose and discuss suicidal ideas  Medication Management: RN will administer medications as ordered by provider, will assess and evaluate patient's response and provide education to patient for prescribed medication. RN will report any adverse and/or side effects to prescribing provider.  Therapeutic Interventions: 1 on 1 counseling sessions, Psychoeducation, Medication administration, Evaluate responses to treatment, Monitor vital signs and CBGs as ordered, Perform/monitor CIWA, COWS, AIMS and Fall Risk screenings as ordered, Perform wound care treatments as ordered.  Evaluation of Outcomes: Not Met   LCSW Treatment Plan for Primary Diagnosis: Bipolar disorder, curr episode depressed, severe, w/psychotic features ([place]) Long Term Goal(s): Safe transition to appropriate next level of care at discharge, Engage patient in  therapeutic group addressing interpersonal concerns.  Short Term Goals: Engage patient in aftercare planning with referrals and resources and Increase emotional regulation  Therapeutic Interventions: Assess for all discharge needs, 1 to 1 time with Social worker, Explore available resources and support systems, Assess for adequacy in community support network, Educate family and significant other(s) on suicide prevention, Complete Psychosocial Assessment, Interpersonal group therapy.  Evaluation of Outcomes: Not Met   Progress in Treatment: Attending groups: No Participating in groups: No Taking medication as prescribed: Yes, MD continues to assess for medication changes as needed Toleration medication: Yes, no side effects reported at this time Family/Significant other contact made: Yes Patient understands diagnosis: Yes, requested treatment and medication management  Discussing patient identified problems/goals with staff: Yes Medical problems stabilized or resolved: Yes Denies suicidal/homicidal ideation: No endorses SI Issues/concerns per patient self-inventory: None Other: N/A  New problem(s) identified: None identified at this time.   New Short Term/Long Term Goal(s): None identified at this time.   Discharge Plan or Barriers: Return home, follow up outpatient   Reason for Continuation of Hospitalization: Anxiety Depression Hallucinations Medication stabilization Suicidal ideation   Estimated Length of Stay: 3-5 days  Attendees: Patient: 02/19/2016  11:52 AM  Physician: Dr. Shea Evans 02/19/2016  11:52 AM  Nursing: Nicoletta Dress. Viona Gilmore, RN  02/19/2016  11:52 AM  RN Care Manager: Lars Pinks 02/19/2016  11:52 AM  Social Worker: Ripley Fraise, LCSW 02/19/2016  11:52 AM  Recreational Therapist: Winfield Cunas 02/19/2016  11:52 AM  Other: Radonna Ricker, Social Work Intern  02/19/2016  11:52 AM  Other:  02/19/2016  11:52 AM  Other: 02/19/2016  11:52 AM    Scribe for Treatment  Team: Radonna Ricker, Social Work Intern 02/19/2016 11:52 AM

## 2016-02-19 NOTE — Progress Notes (Signed)
Recreation Therapy Notes  Date: 02/19/16 Time: 1000 Location: 500 [address] Dayroom  Group Topic: Leisure Education  Goal Area(s) Addresses:  Patient will identify positive leisure activities.  Patient will identify one positive benefit of participation in leisure activities.   Behavioral Response: Engaged  Intervention: Dry erase board, eraser, dry erase marker, various activities on strips of paper  Activity: Leisure Pictionary.  One patient will come to the board and pick a strip of paper from the container, which contains an activity on it, and draw it on the board.  The remaining patients will attempt to guess what is being drawn on the board.  The person that guesses the picture, will go next.  If there are enough participants, the group can be broken up into teams.  Education:  Leisure Education, Building control surveyorDischarge Planning  Education Outcome: Acknowledges education/In group clarification offered/Needs additional education  Clinical Observations/Feedback: Pt was active but flat.  Pt also seemed depressed.  Pt left early with social worker but returned.  Pt stated leisure "can help you get outside of your head".      Caroll RancherMarjette Bali Lyn, LRT/CTRS      Caroll RancherLindsay, Danial Hlavac A 02/19/2016 11:56 AM

## 2016-02-19 NOTE — Progress Notes (Signed)
D: Pt A & O X4. Presents with depressed affect and mood. Denies SI, HI and AVH at this time.  Rates her depression 4/10 and anxiety 0/10. Pt reports social anxiety, "I don't know if I can go to the dinning room but I will try". Observed interacting well with peers and staff, guarded but forwards during conversation.  A: Emotional support and availability provided to pt. Encouraged to voice concerns and comply with unit routines and treatment regimen. Scheduled and PRN (Tylenol) medications administered as prescribed with verbal education. EKG done as ordered and placed in front of chart. Q 15 minutes checks done for safety without self harm gestures or outburst to note at present.  R: Pt receptive to care. Pt did not attend scheduled unit groups when prompted "I'm just tired". Compliant with medications when offered. Denies adverse drug reactions when assessed. Tolerated EKG and all PO intake well. Safety maintained on unit. POC remains effective.
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
## 2016-02-19 NOTE — BH Assessment (Addendum)
Per Nanine MeansJamison Lord, DNP, patient meets criteria for INPT treatment. TTS to seek placement. Per AC-Lindsay patient will have a bed assignment on the 500 hall this evening.   Pt has been assigned to 507 bed 1 by Hassie BruceKim, AC. Attending physician will be Dr. Elna BreslowEappen. Report may be called to [id number]. Pt RN informed of disposition.   Support paperwork has been completed.

## 2016-02-20 LAB — HEMOGLOBIN A1C
Hgb A1c MFr Bld: 5.1 % (ref 4.8–5.6)
Mean Plasma Glucose: 100 mg/dL

## 2016-02-20 LAB — PROLACTIN: Prolactin: 21.1 ng/mL (ref 4.8–23.3)

## 2016-02-20 MED ORDER — HYDROXYZINE HCL 50 MG PO TABS: 50.0000 mg | ORAL_TABLET | Freq: Every evening | ORAL | Status: DC | PRN

## 2016-02-20 MED ORDER — LURASIDONE HCL 80 MG PO TABS
80.0000 mg | ORAL_TABLET | Freq: Every day | ORAL | Status: DC
  Administered 2016-02-20 – 2016-02-21 (×2): 80 mg via ORAL
  Filled 2016-02-20 (×4): qty 1

## 2016-02-20 MED ORDER — LAMOTRIGINE 25 MG PO TABS
50.0000 mg | ORAL_TABLET | Freq: Every day | ORAL | Status: AC
  Administered 2016-02-21: 50 mg via ORAL
  Filled 2016-02-20: qty 2

## 2016-02-20 MED ORDER — MIRTAZAPINE 15 MG PO TABS
15.0000 mg | ORAL_TABLET | Freq: Every day | ORAL | Status: DC
  Administered 2016-02-20 – 2016-02-21 (×2): 15 mg via ORAL
  Filled 2016-02-20 (×4): qty 1

## 2016-02-20 NOTE — Progress Notes (Signed)
D: Pt denies SI/HI/AVH. Pt is pleasant and cooperative. Pt stayed in room majority of the evening. Pt very reluctant to answer questions, pt isolates.   A: Pt was offered support and encouragement. Pt was given scheduled medications. Pt was encourage to attend groups. Q 15 minute checks were done for safety.   R:Pt attends groups and interacts well with peers and staff. Pt is taking medication. Pt has no complaints.Pt receptive to treatment and safety maintained on unit.

## 2016-02-20 NOTE — Progress Notes (Signed)
DAR NOTE: Patient presents with depressed mood and affect. Patient was tearful during assessment and would not respond to questions.  Denies auditory and visual hallucinations.  Rates depression at 5, hopelessness at 5, and anxiety at 5.  Described energy level as normal and concentration as good.  Maintained on routine safety checks.  Medications given as prescribed.  Support and encouragement offered as needed.  Attended group and participated.  States goal for today is "going home."  Patient was visible in the dayroom coloring and drawing.  No interaction with peers.  Patient received Tylenol 650 mg for generalized discomfort with good effect.

## 2016-02-20 NOTE — Progress Notes (Signed)
Recreation Therapy Notes  INPATIENT RECREATION THERAPY ASSESSMENT  Patient Details Name: Tami HarmsLillyan S Everitt MRN: [id number] DOB: 15-Dec-1975 Today's Date: 02/20/2016  Patient Stressors: Family, Relationship, Work, Other (Comment) (Bills)  Pt stated she was here because she was having suicidal thoughts. Pt stated her kids, mother-in-law, bills, being out of work and her boyfriend breaking up with her are all stressors.  Coping Skills:   Isolate, Avoidance, Self-Injury, Exercise, Art/Dance, Talking  Personal Challenges: Communication, Decision-Making, Expressing Yourself, Social Interaction, Stress Management, Trusting Others  Leisure Interests (2+):  Social - Family, Crafts - Painting, Crafts - Other (Comment) Psychiatrist(Sketch)  Awareness of Community Resources:  Yes  Community Resources:  Park, Coffee Shop, Recreation Center  Current Use: No  If no, Barriers?: Social ("I don't like to be around people")  Patient Strengths:  "Try to give 150% with everything, honest"  Patient Identified Areas of Improvement:  Honesty, trying too hard  Current Recreation Participation:  Never  Patient Goal for Hospitalization:  "Hope to qualify for disability, get meds right to go home"  [place]  [place] Residence[place]   Current [place] (including self-harm):  No  Current HI:  No  Consent to Intern Participation: N/A   Caroll RancherMarjette Darlisha Kelm, LRT/CTRS  Caroll RancherLindsay, Alick Lecomte A 02/20/2016, 11:14 AM

## 2016-02-20 NOTE — [facility] Group Notes (Signed)
[facility] Group Notes:  (Counselor/Nursing/MHT/Case Management/Adjunct)  02/20/2016 1:15PM  Type of Therapy:  Group Therapy  Participation Level:  Active  Participation Quality:  Appropriate  Affect:  Flat  Cognitive:  Oriented  Insight:  Improving  Engagement in Group:  Limited  Engagement in Therapy:  Limited  Modes of Intervention:  Discussion, Exploration and Socialization  Summary of Progress/Problems: The topic for group was balance in life.  Pt participated in the discussion about when their life was in balance and out of balance and how this feels.  Pt discussed ways to get back in balance and short term goals they can work on to get where they want to be. Stayed the entire time, initially minimally engaged, guarded, grumpy, but became warmer and more engaged with time.  States she is unbalanced and knows this because of her anxiety level.  Identified coloring as something that helps her cope and find balance, and, in fact, she was coloring most of the session.  Talked about her frustration with inability to work.  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

## 2016-02-20 NOTE — Progress Notes (Signed)
[facility]  02/20/2016 11:55 AM Tami Miller  MRN:  [id number] Subjective:  Patient states " I am so depressed, I do not want to be here.'  Objective: Tami CablesLillyan S Miller a 40 y.o.AA female, who is single , unemployed, [place] in [place] her son, who has a  history of Bipolar Disorder.,who [facility]  [facility] , was brought in by mother for worsening sx of depression, sleep and SI.  Patient seen and chart reviewed.Discussed patient with treatment team.  Pt today seen withdrawn to bed , tearful , crying . Pt ruminates about her situation and her children.Pt also reports sleep as restless. Pt with multiple psychosocial stressors like her step dad's death anniversary, job loss, being single mother , recent finding of having a breast lesion, which may need further work up and so on. Patient per staff continues to need a lot of encouragement and support.    Principal Problem: Bipolar disorder, curr episode depressed, severe, w/psychotic features (HCC) Diagnosis:   Patient Active Problem List   Diagnosis Date Noted  . Bipolar disorder, curr episode depressed, severe, w/psychotic features (HCC) [F31.5] 02/19/2016  . Bereavement [Z63.4] 02/19/2016  . PTSD (post-traumatic stress disorder) [F43.10] 02/19/2016  . Alcohol use disorder, moderate, dependence (HCC) [F10.20] 02/19/2016  . Cannabis use disorder, moderate, dependence (HCC) [F12.20] 02/19/2016   Total Time spent with patient: 25 minutes  Past Psychiatric History: Please see H&P.   Past Medical History:  Past Medical History:  Diagnosis Date  . Bipolar affective disorder (HCC)   . Depression    History reviewed. No pertinent surgical history. Family History:  Family History  Problem Relation Age of Onset  . Bipolar disorder Mother   . Breast cancer Mother   . Breast cancer Maternal Grandmother    Family Psychiatric  History: Please see H&P.  Social History: Please see H&P.  History  Alcohol Use  . Yes     Comment: daily     History  Drug Use  . Types: Marijuana    Social History   Social History  . Marital status: Single    Spouse name: N/A  . Number of children: N/A  . Years of education: N/A   Social History Main Topics  . Smoking status: Current Every Day Smoker    Packs/day: 0.50    Years: 0.50  . Smokeless tobacco: Never Used  . Alcohol use Yes     Comment: daily  . Drug use:     Types: Marijuana  . Sexual activity: Yes    Birth control/ protection: IUD, Condom   Other Topics Concern  . None   Social History Narrative  . None   Additional Social History:                         Sleep: restless  Appetite:  Fair  Current Medications: Current Facility-Administered Medications  Medication Dose Route Frequency Provider Last Rate Last Dose  . acetaminophen (TYLENOL) tablet 650 mg  650 mg Oral Q6H PRN Kerry HoughSpencer E Simon, PA-C   650 mg at 02/20/16 [id number]  . alum & mag hydroxide-simeth (MAALOX/MYLANTA) 200-200-20 MG/5ML suspension 30 mL  30 mL Oral Q4H PRN Kerry HoughSpencer E Simon, PA-C      . divalproex (DEPAKOTE ER) 24 hr tablet 750 mg  750 mg Oral QHS Crysten Kaman, MD   750 mg at 02/19/16 2231  . feeding supplement (ENSURE ENLIVE) (ENSURE ENLIVE) liquid 237 mL  237 mL Oral  BID PRN Jomarie Longs, MD      . hydrOXYzine (ATARAX/VISTARIL) tablet 25 mg  25 mg Oral Q6H PRN Kerry Hough, PA-C      . [START ON 02/21/2016] lamoTRIgine (LAMICTAL) tablet 50 mg  50 mg Oral Daily Dortha Neighbors, MD      . loperamide (IMODIUM) capsule 2-4 mg  2-4 mg Oral PRN Kerry Hough, PA-C      . LORazepam (ATIVAN) tablet 1 mg  1 mg Oral Q6H PRN Kerry Hough, PA-C      . LORazepam (ATIVAN) tablet 1 mg  1 mg Oral TID Kerry Hough, PA-C   1 mg at 02/20/16 1610   Followed by  . [START ON 02/21/2016] LORazepam (ATIVAN) tablet 1 mg  1 mg Oral BID Kerry Hough, PA-C       Followed by  . [START ON 02/22/2016] LORazepam (ATIVAN) tablet 1 mg  1 mg Oral Daily Spencer E Simon, PA-C       . lurasidone (LATUDA) tablet 80 mg  80 mg Oral Q supper Gomer France, MD      . magnesium hydroxide (MILK OF MAGNESIA) suspension 30 mL  30 mL Oral Daily PRN Kerry Hough, PA-C   30 mL at 02/20/16 1056  . mirtazapine (REMERON) tablet 7.5 mg  7.5 mg Oral QHS Madilynne Mullan, MD   7.5 mg at 02/19/16 2231  . multivitamin with minerals tablet 2 tablet  2 tablet Oral Daily Kerry Hough, PA-C   2 tablet at 02/20/16 9604  . ondansetron (ZOFRAN-ODT) disintegrating tablet 4 mg  4 mg Oral Q6H PRN Kerry Hough, PA-C      . thiamine (B-1) injection 100 mg  100 mg Intramuscular Once [employer], PA-C      . thiamine (VITAMIN B-1) tablet 100 mg  100 mg Oral Daily Kerry Hough, PA-C   100 mg at 02/20/16 0824  . venlafaxine (EFFEXOR) tablet 50 mg  50 mg Oral Q breakfast Jomarie Longs, MD   50 mg at 02/20/16 0823  . [START ON 02/21/2016] venlafaxine XR (EFFEXOR-XR) 24 hr capsule 37.5 mg  37.5 mg Oral Q breakfast Jomarie Longs, MD        Lab Results:  Results for orders placed or performed during the hospital encounter of 02/19/16 (from the past 48 hour(s))  Hemoglobin A1c     Status: None   Collection Time: 02/19/16  6:13 AM  Result Value Ref Range   Hgb A1c MFr Bld 5.1 4.8 - 5.6 %    Comment: (NOTE)         Pre-diabetes: 5.7 - 6.4         Diabetes: >6.4         Glycemic control for adults with diabetes: <7.0    Mean Plasma Glucose 100 mg/dL    Comment: (NOTE) Performed At: [facility] [address] [place], [place] [id number] Mila Homer MD YN:[id number] Performed at [facility]   Lipid panel     Status: None   Collection Time: 02/19/16  6:13 AM  Result Value Ref Range   Cholesterol 147 0 - 200 mg/dL   Triglycerides 80 <657 mg/dL   HDL 75 >84 mg/dL   Total CHOL/HDL Ratio 2.0 RATIO   VLDL 16 0 - 40 mg/dL   LDL Cholesterol 56 0 - 99 mg/dL    Comment:        Total Cholesterol/HDL:CHD Risk Coronary Heart Disease Risk Table  Men   Women  1/2 Average Risk   3.4   3.3  Average Risk       5.0   4.4  2 X Average Risk   9.6   7.1  3 X Average Risk  23.4   11.0        Use the calculated Patient Ratio above and the CHD Risk Table to determine the patient's CHD Risk.        ATP III CLASSIFICATION (LDL):  <100     mg/dL   Optimal  161-096  mg/dL   Near or Above                    Optimal  130-159  mg/dL   Borderline  045-409  mg/dL   High  >811     mg/dL   Very High Performed at [facility]   TSH     Status: None   Collection Time: 02/19/16  6:13 AM  Result Value Ref Range   TSH 2.004 0.350 - 4.500 uIU/mL    Comment: Performed by a 3rd Generation assay with a functional sensitivity of <=0.01 uIU/mL. Performed at [facility]   Prolactin     Status: None   Collection Time: 02/19/16  6:13 AM  Result Value Ref Range   Prolactin 21.1 4.8 - 23.3 ng/mL    Comment: (NOTE) Performed At: [facility] [address] [place], [place] [id number] Mila Homer MD OZ:[id number] Performed at [facility]     Blood Alcohol level:  Lab Results  Component Value Date   ETH 8 (H) 02/18/2016    Metabolic Disorder Labs: Lab Results  Component Value Date   HGBA1C 5.1 02/19/2016   MPG 100 02/19/2016   Lab Results  Component Value Date   PROLACTIN 21.1 02/19/2016   Lab Results  Component Value Date   CHOL 147 02/19/2016   TRIG 80 02/19/2016   HDL 75 02/19/2016   CHOLHDL 2.0 02/19/2016   VLDL 16 02/19/2016   LDLCALC 56 02/19/2016    Physical Findings: AIMS: Facial and Oral Movements Muscles of Facial Expression: None, normal Lips and Perioral Area: None, normal Jaw: None, normal Tongue: None, normal,Extremity Movements Upper (arms, wrists, hands, fingers): None, normal Lower (legs, knees, ankles, toes): None, normal, Trunk Movements Neck, shoulders, hips: None, normal, Overall Severity Severity of abnormal movements (highest score from  questions above): None, normal Incapacitation due to abnormal movements: None, normal Patient's awareness of abnormal movements (rate only patient's report): No Awareness, Dental Status Current problems with teeth and/or dentures?: No Does patient usually wear dentures?: No  CIWA:  CIWA-Ar Total: 0 COWS:     Musculoskeletal: Strength & Muscle Tone: within normal limits Gait & Station: normal Patient leans: N/A  Psychiatric Specialty Exam: Physical Exam  Nursing note and vitals reviewed. Constitutional:  I concur with PE done in ED.    Review of Systems  Psychiatric/Behavioral: Positive for depression. The patient is nervous/anxious.   All other systems reviewed and are negative.   Blood pressure (!) 126/94, pulse 76, temperature 98.8 F (37.1 C), temperature source Oral, resp. rate 18, height 5\' 7"  (1.702 m), weight 88 kg (194 lb).Body mass index is 30.38 kg/m.  General Appearance: Casual  Eye Contact:  Minimal  Speech:  Slow  Volume:  Decreased  Mood:  Anxious and Depressed  Affect:  Tearful  Thought Process:  Goal Directed and Descriptions of Associations: Intact  Orientation:  Full (Time,  Place, and Person)  Thought Content:  Rumination  Suicidal Thoughts:  No  Homicidal Thoughts:  No  Memory:  Immediate;   Fair Recent;   Fair Remote;   Fair  Judgement:  Impaired  Insight:  Lacking  Psychomotor Activity:  Normal  Concentration:  Concentration: Poor and Attention Span: Poor  Recall:  [employer] of Knowledge:  Fair  Language:  Fair  Akathisia:  No  Handed:  Right  AIMS (if indicated):     Assets:  Desire for Improvement  ADL's:  Intact  Cognition:  WNL  Sleep:  Number of Hours: 6.75     Treatment Plan Summary:Cassidy S Miller a 40 y.o.AA female, who is single , unemployed, lives in [place] with her son, who has a  history of Bipolar Disorder.,who presented  to [facility] , was brought in by mother for worsening sx of depression, sleep and SI.Patient today is seen  as tearful, depressed and in distress . Pt will continue to need IP admission and treatment.  Daily contact with patient to assess and evaluate symptoms and progress in treatment and Medication management  Daily contact with patient to assess and evaluate symptoms and progress in treatment and Medication management Will continue to taper of  lamictal and continue to uptirtate Depakote ER, current dose of  750 mg po qhs for mood sx.Depakote level in 5 days. Will increase Latuda to 80  mg po qhs for psychosis/mood sx. Will taper down effexor for lack of efficacy.Effexor to be continued for 2 weeks and discontinued. Will increase Remeron to 15 mg po qhs for sleep, anxiety sx. Will add Vistaril 50 mg po qhs prn for sleep. Will continue CIWA/Ativan protocol for alcohol withdrawal sx. Will continue to monitor vitals ,medication compliance and treatment side effects while patient is here.  Will monitor for medical issues as well as call consult as needed.  Reviewed labs uds- pos for thc,bal 8, lipid panel- wnl, K+ - 3.4 - will repeat , cbc- wnl , tsh- wnl , pending hba1c, pl- wnl  ,  EKG for qtc - wnl . CSW will continue working on disposition. Patient will benefit from grief counseling /IOP/substance abuse program. Patient to participate in therapeutic milieu .  Alita Waldren, MD 02/20/2016, 11:55 AM

## 2016-02-21 DIAGNOSIS — F17210 Nicotine dependence, cigarettes, uncomplicated: Secondary | ICD-10-CM

## 2016-02-21 NOTE — [facility] Group Notes (Signed)
[facility] LCSW Group Therapy  02/21/2016  1:05 PM  Type of Therapy:  Group therapy  Participation Level:  Active  Participation Quality:  Attentive  Affect:  Flat  Cognitive:  Oriented  Insight:  Limited  Engagement in Therapy:  Limited  Modes of Intervention:  Discussion, Socialization  Summary of Progress/Problems:  Chaplain was here to lead a group on themes of hope and courage. Stayed the entire time, engaged throughout.  Spoke at length about courage for her.  Shared that she has no problem with asking for help because she likes hearing different people's perspective.  The way she uses courage is to break down overwhelming challenges to one step at a time.  She has used that often, and has found it to be effective.  Also talked about her difficulty in having faith that others will follow through as they promise.  "I hope that we will all listen to each other.  I have definitely heard helpful things from others as I have listened."   Tami Miller, Tami Miller 02/21/2016 1:48 PM

## 2016-02-21 NOTE — Progress Notes (Signed)
DAR NOTE: Patient presents with calm and pleasant mood.  Denies auditory and visual hallucinations.  Rates depression at 3, hopelessness at 0, and anxiety at 2.  Maintained on routine safety checks.  Medications given as prescribed.  Support and encouragement offered as needed.  Attended group and participated.  States goal for today is "going home."  Patient observed socializing with peers in the dayroom.  Tylenol 650 mg given for complain of headache with good effect.

## 2016-02-21 NOTE — Progress Notes (Signed)
Recreation Therapy Notes  Date: 02/21/16 Time: 1000 Location: 500 Hall Dayroom  Group Topic: Leisure Education  Goal Area(s) Addresses:  Patient will identify positive leisure activities.  Patient will identify one positive benefit of participation in leisure activities.   Behavioral Response: Engaged  Intervention: 20 plastic cups, 2 soft foam balls  Activity: Bowling.  Patients were divided into two teams.  Each patient got two chances to knock down all the "pins".  Patients received a point for each "pin" they knocked.  The first team to reach 100 won.    Education:  Leisure Education, Building control surveyorDischarge Planning  Education Outcome: Acknowledges education/In group clarification offered/Needs additional education  Clinical Observations/Feedback: Pt was active, smiling and social with her peers.  Pt assisted with setting up the "pins".  Pt encouraged her peers and stated she enjoyed the activity.    Caroll RancherMarjette Aynslee Mulhall, LRT/CTRS         Lillia AbedLindsay, Monique Hefty A 02/21/2016 1:07 PM

## 2016-02-21 NOTE — Progress Notes (Signed)
  [facility] :  Will you be returning to the same living situation after discharge:  Yes,  home At discharge, do you have transportation home?: Yes,  mother Do you have the ability to pay for your medications: Yes,  MCD  Release of information consent forms completed and in the chart;  Patient's signature needed at discharge.  Patient to Follow up at: Follow-up Information    [facility] .   Specialty:  Family Medicine Why:  Call and set up appointment. Social Work left messages and never got a response.  Contact information: [address] [place] [phone]           Next level of care provider has [facility] to [facility] Link:no  Safety Planning and Suicide Prevention discussed: Yes,  yes  Have you used any form of tobacco in the last 30 days? (Cigarettes, Smokeless Tobacco, Cigars, and/or Pipes): Yes  Has patient been referred to the Quitline?: Patient refused referral  Patient has been referred for addiction treatment: Yes  Ida RogueRodney B Dhilan Brauer 02/21/2016, 3:42 PM

## 2016-02-21 NOTE — Tx Team (Signed)
Interdisciplinary Treatment and Diagnostic Plan Update  02/21/2016 Time of Session: 3:34 PM  Tami Miller MRN: [id number]  Principal Diagnosis: Bipolar disorder, curr episode depressed, severe, w/psychotic features ([place])  Secondary Diagnoses: Principal Problem:   Bipolar disorder, curr episode depressed, severe, w/psychotic features ([place]) Active Problems:   Bereavement   PTSD (post-traumatic stress disorder)   Alcohol use disorder, moderate, dependence ([place])   Cannabis use disorder, moderate, dependence ([place])   Current Medications:  Current Facility-Administered Medications  Medication Dose Route Frequency Provider Last Rate Last Dose  . acetaminophen (TYLENOL) tablet 650 mg  650 mg Oral Q6H PRN Laverle Hobby, PA-C   650 mg at 02/21/16 1102  . alum & mag hydroxide-simeth (MAALOX/MYLANTA) 200-200-20 MG/5ML suspension 30 mL  30 mL Oral Q4H PRN Laverle Hobby, PA-C      . divalproex (DEPAKOTE ER) 24 hr tablet 750 mg  750 mg Oral QHS Ursula Alert, MD   750 mg at 02/20/16 2243  . feeding supplement (ENSURE ENLIVE) (ENSURE ENLIVE) liquid 237 mL  237 mL Oral BID PRN Ursula Alert, MD      . hydrOXYzine (ATARAX/VISTARIL) tablet 25 mg  25 mg Oral Q6H PRN Laverle Hobby, PA-C      . hydrOXYzine (ATARAX/VISTARIL) tablet 50 mg  50 mg Oral QHS PRN Saramma Eappen, MD      . loperamide (IMODIUM) capsule 2-4 mg  2-4 mg Oral PRN Laverle Hobby, PA-C      . LORazepam (ATIVAN) tablet 1 mg  1 mg Oral Q6H PRN Laverle Hobby, PA-C      . LORazepam (ATIVAN) tablet 1 mg  1 mg Oral BID Laverle Hobby, PA-C   1 mg at 02/21/16 0800   Followed by  . [START ON 02/22/2016] LORazepam (ATIVAN) tablet 1 mg  1 mg Oral Daily Spencer E Simon, PA-C      . lurasidone (LATUDA) tablet 80 mg  80 mg Oral Q supper Ursula Alert, MD   80 mg at 02/20/16 1706  . magnesium hydroxide (MILK OF MAGNESIA) suspension 30 mL  30 mL Oral Daily PRN Laverle Hobby, PA-C   30 mL at 02/20/16 1056  . mirtazapine (REMERON)  tablet 15 mg  15 mg Oral QHS Ursula Alert, MD   15 mg at 02/20/16 2243  . multivitamin with minerals tablet 2 tablet  2 tablet Oral Daily Laverle Hobby, PA-C   2 tablet at 02/21/16 5003  . ondansetron (ZOFRAN-ODT) disintegrating tablet 4 mg  4 mg Oral Q6H PRN Laverle Hobby, PA-C      . thiamine (B-1) injection 100 mg  100 mg Intramuscular Once [employer], PA-C      . thiamine (VITAMIN B-1) tablet 100 mg  100 mg Oral Daily Laverle Hobby, PA-C   100 mg at 02/21/16 0800  . venlafaxine XR (EFFEXOR-XR) 24 hr capsule 37.5 mg  37.5 mg Oral Q breakfast Ursula Alert, MD   37.5 mg at 02/21/16 7048    PTA Medications: Prescriptions Prior to Admission  Medication Sig Dispense Refill Last Dose  . acetaminophen (TYLENOL) 500 MG tablet Take 1,000 mg by mouth every 6 (six) hours as needed for mild pain or moderate pain.   02/19/2016 at Unknown time  . Brexpiprazole (REXULTI) 1 MG TABS Take 1 mg by mouth daily.   Past Week at Unknown time  . lamoTRIgine (LAMICTAL) 200 MG tablet Take 200 mg by mouth daily.   02/19/2016 at Unknown time  . Lurasidone  HCl (LATUDA) 60 MG TABS Take 60 mg by mouth daily.   02/19/2016 at Unknown time  . Multiple Vitamin (MULTIVITAMIN) tablet Take 2 tablets by mouth daily.   02/18/2016 at Unknown time  . venlafaxine (EFFEXOR) 75 MG tablet Take 75 mg by mouth 2 (two) times daily.   02/19/2016 at Unknown time    Treatment Modalities: Medication Management, Group therapy, Case management,  1 to 1 session with clinician, Psychoeducation, Recreational therapy.   Physician Treatment Plan for Primary Diagnosis: Bipolar disorder, curr episode depressed, severe, w/psychotic features ([place]) Long Term Goal(s): Improvement in symptoms so as ready for discharge  Short Term Goals: Ability to identify and develop effective coping behaviors will improve  Medication Management: Evaluate patient's response, side effects, and tolerance of medication regimen.  Therapeutic  Interventions: 1 to 1 sessions, Unit Group sessions and Medication administration.  Evaluation of Outcomes: Adequate for Discharge  Physician Treatment Plan for Secondary Diagnosis: Principal Problem:   Bipolar disorder, curr episode depressed, severe, w/psychotic features ([place]) Active Problems:   Bereavement   PTSD (post-traumatic stress disorder)   Alcohol use disorder, moderate, dependence ([place])   Cannabis use disorder, moderate, dependence ([place])   Long Term Goal(s): Improvement in symptoms so as ready for discharge  Short Term Goals: Ability to identify triggers associated with substance abuse/mental health issues will improve  Medication Management: Evaluate patient's response, side effects, and tolerance of medication regimen.  Therapeutic Interventions: 1 to 1 sessions, Unit Group sessions and Medication administration.  Evaluation of Outcomes: Adequate for Discharge   RN Treatment Plan for Primary Diagnosis: Bipolar disorder, curr episode depressed, severe, w/psychotic features ([place]) Long Term Goal(s): Knowledge of disease and therapeutic regimen to maintain health will improve  Short Term Goals: Ability to remain free from injury will improve and Ability to disclose and discuss suicidal ideas  Medication Management: RN will administer medications as ordered by provider, will assess and evaluate patient's response and provide education to patient for prescribed medication. RN will report any adverse and/or side effects to prescribing provider.  Therapeutic Interventions: 1 on 1 counseling sessions, Psychoeducation, Medication administration, Evaluate responses to treatment, Monitor vital signs and CBGs as ordered, Perform/monitor CIWA, COWS, AIMS and Fall Risk screenings as ordered, Perform wound care treatments as ordered.  Evaluation of Outcomes: Adequate for Discharge   LCSW Treatment Plan for Primary Diagnosis: Bipolar disorder, curr episode depressed, severe,  w/psychotic features ([place]) Long Term Goal(s): Safe transition to appropriate next level of care at discharge, Engage patient in therapeutic group addressing interpersonal concerns.  Short Term Goals: Engage patient in aftercare planning with referrals and resources and Increase emotional regulation  Therapeutic Interventions: Assess for all discharge needs, 1 to 1 time with Social worker, Explore available resources and support systems, Assess for adequacy in community support network, Educate family and significant other(s) on suicide prevention, Complete Psychosocial Assessment, Interpersonal group therapy.  Evaluation of Outcomes: Met Return homne, follow up outpt   Progress in Treatment: Attending groups: Yes Participating in groupsYes Taking medication as prescribed: Yes, MD continues to assess for medication changes as needed Toleration medication: Yes, no side effects reported at this time Family/Significant other contact made: Yes Patient understands diagnosis: Yes, requested treatment and medication management  Discussing patient identified problems/goals with staff: Yes Medical problems stabilized or resolved: Yes Denies suicidal/homicidal ideation: No endorses SI Issues/concerns per patient self-inventory: None Other: N/A  New problem(s) identified: None identified at this time.   New Short Term/Long Term Goal(s): None identified at this time.  Discharge Plan or Barriers: Return home, follow up outpatient   Reason for Continuation of Hospitalization:    Estimated Length of Stay: Likely d/c Saturday, Sunday at the latest  Attendees: Patient: 02/21/2016  3:34 PM  Physician: Dr. Shea Evans 02/21/2016  3:34 PM  Nursing: Nicoletta Dress. Viona Gilmore, RN  02/21/2016  3:34 PM  RN Care Manager: Lars Pinks 02/21/2016  3:34 PM  Social Worker: Ripley Fraise, LCSW 02/21/2016  3:34 PM  Recreational Therapist: Winfield Cunas 02/21/2016  3:34 PM  Other: Radonna Ricker, Social Work Intern   02/21/2016  3:34 PM  Other:  02/21/2016  3:34 PM  Other: 02/21/2016  3:34 PM    Scribe for Treatment Team: Radonna Ricker, Social Work Intern 02/21/2016 3:34 PM

## 2016-02-21 NOTE — Progress Notes (Signed)
Patient ID: Tami HarmsLillyan S Gamm, female   DOB: 1975/07/20, 40 y.o.   MRN: [id number]  D: Patient in room on approach. Pt presented with depressed mood and flat affect. Pt reports feeling tired from unit activities, and want to go to bed. Pt attended evening wrap up group and engaged in discussions. Pt denies SI/HI/AVH and pain. Cooperative with assessment.   A: Medications administered as prescribed. Support and encouragement offered as needed.    R: Patient remains safe and complaint with medications

## 2016-02-21 NOTE — Progress Notes (Signed)
[facility] MD Progress Note  02/21/2016 11:55 AM Tami Miller  MRN:  [id number] Subjective:  Patient states that she has improved compared to admission.  She was prescribed Rexulti and had adverse reaction to it.  Her mother felt she was not herself.  Since that medication has been discontinued , she continues to improve her mood and future oriented one discharged.  "  Objective: Tami Miller female, who is single , unemployed, lives in [place] with her son, who has a  history of Bipolar Disorder.,who presented  to [facility] , was brought in by mother for worsening sx of depression, sleep and SI.  Patient states that she has appt lined up for possible jobs.  Also, a breast lesion discovered 3 weeks ago and an US breast is scheduled on Tuesday.  She denies SI and HI.    Principal Problem: Bipolar disorder, curr episode depressed, severe, w/psychotic features (HCC) Diagnosis:   Patient Active Problem List   Diagnosis Date Noted  . Bipolar disorder, curr episode depressed, severe, w/psychotic features (HCC) [F31.5] 02/19/2016  . Bereavement [Z63.4] 02/19/2016  . PTSD (post-traumatic stress disorder) [F43.10] 02/19/2016  . Alcohol use disorder, moderate, dependence (HCC) [F10.20] 02/19/2016  . Cannabis use disorder, moderate, dependence (HCC) [F12.20] 02/19/2016   Total Time spent with patient: 25 minutes  Past Psychiatric History: Please see H&P.   Past Medical History:  Past Medical History:  Diagnosis Date  . Bipolar affective disorder (HCC)   . Depression    History reviewed. No pertinent surgical history. Family History:  Family History  Problem Relation Age of Onset  . Bipolar disorder Mother   . Breast cancer Mother   . Breast cancer Maternal Grandmother    Family Psychiatric  History: Please see H&P.  Social History: Please see H&P.  History  Alcohol Use  . Yes    Comment: daily     History  Drug Use  . Types: Marijuana    Social History   Social  History  . Marital status: Single    Spouse name: N/A  . Number of children: N/A  . Years of education: N/A   Social History Main Topics  . Smoking status: Current Every Day Smoker    Packs/day: 0.50    Years: 0.50  . Smokeless tobacco: Never Used  . Alcohol use Yes     Comment: daily  . Drug use:     Types: Marijuana  . Sexual activity: Yes    Birth control/ protection: IUD, Condom   Other Topics Concern  . None   Social History Narrative  . None   Additional Social History:                         Sleep: restless  Appetite:  Fair  Current Medications: Current Facility-Administered Medications  Medication Dose Route Frequency Provider Last Rate Last Dose  . acetaminophen (TYLENOL) tablet 650 mg  650 mg Oral Q6H PRN Kerry Hough, PA-C   650 mg at 02/21/16 1102  . alum & mag hydroxide-simeth (MAALOX/MYLANTA) 200-200-20 MG/5ML suspension 30 mL  30 mL Oral Q4H PRN Kerry Hough, PA-C      . divalproex (DEPAKOTE ER) 24 hr tablet 750 mg  750 mg Oral QHS Jomarie Longs, MD   750 mg at 02/20/16 2243  . feeding supplement (ENSURE ENLIVE) (ENSURE ENLIVE) liquid 237 mL  237 mL Oral BID PRN Jomarie Longs, MD      .  hydrOXYzine (ATARAX/VISTARIL) tablet 25 mg  25 mg Oral Q6H PRN Kerry HoughSpencer E Simon, PA-C      . hydrOXYzine (ATARAX/VISTARIL) tablet 50 mg  50 mg Oral QHS PRN Jomarie LongsSaramma Eappen, MD      . loperamide (IMODIUM) capsule 2-4 mg  2-4 mg Oral PRN Kerry HoughSpencer E Simon, PA-C      . LORazepam (ATIVAN) tablet 1 mg  1 mg Oral Q6H PRN Kerry HoughSpencer E Simon, PA-C      . LORazepam (ATIVAN) tablet 1 mg  1 mg Oral BID Kerry HoughSpencer E Simon, PA-C   1 mg at 02/21/16 0800   Followed by  . [START ON 02/22/2016] LORazepam (ATIVAN) tablet 1 mg  1 mg Oral Daily Spencer E Simon, PA-C      . lurasidone (LATUDA) tablet 80 mg  80 mg Oral Q supper Jomarie LongsSaramma Eappen, MD   80 mg at 02/20/16 1706  . magnesium hydroxide (MILK OF MAGNESIA) suspension 30 mL  30 mL Oral Daily PRN Kerry HoughSpencer E Simon, PA-C   30 mL at  02/20/16 1056  . mirtazapine (REMERON) tablet 15 mg  15 mg Oral QHS Jomarie LongsSaramma Eappen, MD   15 mg at 02/20/16 2243  . multivitamin with minerals tablet 2 tablet  2 tablet Oral Daily Kerry HoughSpencer E Simon, PA-C   2 tablet at 02/21/16 [id number]  . ondansetron (ZOFRAN-ODT) disintegrating tablet 4 mg  4 mg Oral Q6H PRN Kerry HoughSpencer E Simon, PA-C      . thiamine (B-1) injection 100 mg  100 mg Intramuscular Once IntelSpencer E Simon, PA-C      . thiamine (VITAMIN B-1) tablet 100 mg  100 mg Oral Daily Kerry HoughSpencer E Simon, PA-C   100 mg at 02/21/16 0800  . venlafaxine XR (EFFEXOR-XR) 24 hr capsule 37.5 mg  37.5 mg Oral Q breakfast Saramma Eappen, MD   37.5 mg at 02/21/16 [id number]    Lab Results:  No results found for this or any previous visit (from the past 48 hour(s)).  Blood Alcohol level:  Lab Results  Component Value Date   ETH 8 (H) 02/18/2016    Metabolic Disorder Labs: Lab Results  Component Value Date   HGBA1C 5.1 02/19/2016   MPG 100 02/19/2016   Lab Results  Component Value Date   PROLACTIN 21.1 02/19/2016   Lab Results  Component Value Date   CHOL 147 02/19/2016   TRIG 80 02/19/2016   HDL 75 02/19/2016   CHOLHDL 2.0 02/19/2016   VLDL 16 02/19/2016   LDLCALC 56 02/19/2016    Physical Findings: AIMS: Facial and Oral Movements Muscles of Facial Expression: None, normal Lips and Perioral Area: None, normal Jaw: None, normal Tongue: None, normal,Extremity Movements Upper (arms, wrists, hands, fingers): None, normal Lower (legs, knees, ankles, toes): None, normal, Trunk Movements Neck, shoulders, hips: None, normal, Overall Severity Severity of abnormal movements (highest score from questions above): None, normal Incapacitation due to abnormal movements: None, normal Patient's awareness of abnormal movements (rate only patient's report): No Awareness, Dental Status Current problems with teeth and/or dentures?: No Does patient usually wear dentures?: No  CIWA:  CIWA-Ar Total: 0 COWS:      Musculoskeletal: Strength & Muscle Tone: within normal limits Gait & Station: normal Patient leans: N/A  Psychiatric Specialty Exam: Physical Exam  Nursing note and vitals reviewed. Constitutional:  I concur with PE done in ED.  Psychiatric: She has a normal mood and affect. Her behavior is normal. Judgment and thought content normal. Cognition and memory are normal.    Review  of Systems  Constitutional: Negative.   HENT: Negative.   Eyes: Negative.   Respiratory: Negative.   Gastrointestinal: Negative.   Genitourinary: Negative.   Musculoskeletal: Negative.   Skin: Negative.   Neurological: Negative.   Endo/Heme/Allergies: Negative.   Psychiatric/Behavioral: Negative.  Negative for depression. The patient is not nervous/anxious.   All other systems reviewed and are negative.   Blood pressure 115/74, pulse 87, temperature 98.9 F (37.2 C), resp. rate 18, height 5\' 7"  (1.702 m), weight 88 kg (194 lb).Body mass index is 30.38 kg/m.  General Appearance: Casual  Eye Contact:  Minimal  Speech:  Slow  Volume:  Decreased  Mood:  Anxious and Depressed  Affect:  Tearful  Thought Process:  Goal Directed and Descriptions of Associations: Intact  Orientation:  Full (Time, Place, and Person)  Thought Content:  Rumination  Suicidal Thoughts:  No  Homicidal Thoughts:  No  Memory:  Immediate;   Fair Recent;   Fair Remote;   Fair  Judgement:  Impaired  Insight:  Lacking  Psychomotor Activity:  Normal  Concentration:  Concentration: Poor and Attention Span: Poor  Recall:  [employer] of Knowledge:  Fair  Language:  Fair  Akathisia:  No  Handed:  Right  AIMS (if indicated):     Assets:  Desire for Improvement  ADL's:  Intact  Cognition:  WNL  Sleep:  Number of Hours: 6.5   Treatment Plan Summary:Jameriah S Miller a 44 Miller female, who is single , unemployed, lives in [place] with her son, who has a  history of Bipolar Disorder.,who presented  to [facility] , was brought in by  mother for worsening sx of depression, sleep and SI.  She continues to improve.  She states that she was quite upset and tearful yesterday morning as she was not able to speak with her son.    Daily contact with patient to assess and evaluate symptoms and progress in treatment and Medication management  Lamictal has been tapered off Cont Depakote ER, current dose of  750 mg po qhs for mood sx.  Depakote level to be drawn 10/22  cont  Latuda to 80  mg po qhs for psychosis/mood sx. Will taper down effexor for lack of efficacy. Effexor to be continued for 2 weeks and discontinued. Cont Remeron to 15 mg po qhs for sleep, anxiety sx. Will add Vistaril 50 mg po qhs prn for sleep. Will continue CIWA/Ativan protocol for alcohol withdrawal sx. Will continue to monitor vitals ,medication compliance and treatment side effects while patient is here.  Will monitor for medical issues as well as call consult as needed.  Reviewed labs uds- pos for thc,bal 8, lipid panel- wnl, K+ - 3.4 - will repeat , cbc- wnl , tsh- wnl , pending hba1c, pl- wnl  ,  EKG for qtc - wnl . CSW will continue working on disposition. Patient will benefit from grief counseling /IOP/substance abuse program. Patient to participate in therapeutic milieu.  Lindwood Qua, NP [facility] 02/21/2016, 11:55 AM

## 2016-02-21 NOTE — Progress Notes (Signed)
Psychoeducational Group Note  Date:  02/21/2016 Time:  2249  Group Topic/Focus:  Wrap-Up Group:   The focus of this group is to help patients review their daily goal of treatment and discuss progress on daily workbooks.   Participation Level: Did Not Attend  Participation Quality:  Not Applicable  Affect:  Not Applicable  Cognitive:  Not Applicable  Insight:  Not Applicable  Engagement in Group: Not Applicable  Additional Comments: The patient did not attend group this evening and elected to remain in her bed.   [name], [name] 02/21/2016, 10:48 PM

## 2016-02-22 DIAGNOSIS — F314 Bipolar disorder, current episode depressed, severe, without psychotic features: Secondary | ICD-10-CM

## 2016-02-22 DIAGNOSIS — F1020 Alcohol dependence, uncomplicated: Secondary | ICD-10-CM

## 2016-02-22 DIAGNOSIS — F1220 Cannabis dependence, uncomplicated: Secondary | ICD-10-CM

## 2016-02-22 DIAGNOSIS — Z634 Disappearance and death of family member: Secondary | ICD-10-CM

## 2016-02-22 MED ORDER — LURASIDONE HCL 80 MG PO TABS: 80.0000 mg | ORAL_TABLET | Freq: Every day | ORAL | 0 refills | Status: DC

## 2016-02-22 MED ORDER — HYDROXYZINE HCL 25 MG PO TABS
25.0000 mg | ORAL_TABLET | Freq: Four times a day (QID) | ORAL | 0 refills | Status: DC | PRN
Start: 2016-02-22 — End: 2023-05-14

## 2016-02-22 MED ORDER — VENLAFAXINE HCL ER 37.5 MG PO CP24: 37.5000 mg | ORAL_CAPSULE | Freq: Every day | ORAL | 0 refills | Status: DC

## 2016-02-22 MED ORDER — MIRTAZAPINE 15 MG PO TABS: 15.0000 mg | ORAL_TABLET | Freq: Every day | ORAL | 0 refills | Status: DC

## 2016-02-22 MED ORDER — HYDROXYZINE HCL 50 MG PO TABS: 50.0000 mg | ORAL_TABLET | Freq: Every evening | ORAL | 0 refills | Status: DC | PRN

## 2016-02-22 MED ORDER — THIAMINE HCL 100 MG PO TABS: 100.0000 mg | ORAL_TABLET | Freq: Every day | ORAL | 0 refills | Status: DC

## 2016-02-22 MED ORDER — DIVALPROEX SODIUM ER 250 MG PO TB24: 750.0000 mg | ORAL_TABLET | Freq: Every day | ORAL | 0 refills | Status: DC

## 2016-02-22 NOTE — [facility] Group Notes (Signed)
[facility] Group Notes:  (Clinical Social Work)  02/22/2016  11:15-12:00PM  Summary of Progress/Problems:   Today's process group involved patients discussing their feelings related to being hospitalized, as well as benefits they see to being in the hospital.  These were itemized on the whiteboard, and then the group brainstormed specific ways in which they could seek for those same benefits to happen when they discharge and go back home. The patient expressed herself freely and beneficially throughout group and then left the group, giving everyone a hug because she was discharging.  Type of Therapy:  Group Therapy - Process  Participation Level:  Active  Participation Quality:  Attentive and Sharing  Affect:  Appropriate  Cognitive:  Alert and Appropriate  Insight:  Engaged  Engagement in Therapy:  Engaged  Modes of Intervention:  Exploration, Discussion  Ambrose MantleMareida Grossman-Orr, LCSW 02/22/2016, 1:12 PM

## 2016-02-22 NOTE — Progress Notes (Signed)
Adult Psychoeducational Group Note  Date:  02/22/2016 Time:  10:31 AM  Group Topic/Focus:  Making Healthy Choices:   The focus of this group is to help patients identify negative/unhealthy choices they were using prior to admission and identify positive/healthier coping strategies to replace them upon discharge.   Participation Level:  Active  Participation Quality:  Appropriate and Attentive  Affect:  Appropriate  Cognitive:  Alert and Appropriate  Insight: Appropriate and Good  Engagement in Group:  Engaged  Modes of Intervention:  Discussion and Education  Esperanza Sheetslizabeth Iwenekha 02/22/2016, 10:31 AM
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
## 2016-02-22 NOTE — [facility] Suicide Risk Assessment (Signed)
[facility]   Principal Problem: Bipolar disorder, curr episode depressed, severe, w/psychotic features [facility](HCC) Discharge Diagnoses:  Patient Active Problem List   Diagnosis Date Noted  . Bipolar disorder, curr episode depressed, severe, w/psychotic features (HCC) [F31.5] 02/19/2016  . Bereavement [Z63.4] 02/19/2016  . PTSD (post-traumatic stress disorder) [F43.10] 02/19/2016  . Alcohol use disorder, moderate, dependence (HCC) [F10.20] 02/19/2016  . Cannabis use disorder, moderate, dependence (HCC) [F12.20] 02/19/2016    Total Time spent with patient: 30 minutes  Musculoskeletal: Strength & Muscle Tone: within normal limits Gait & Station: normal Patient leans: no lean  Psychiatric Specialty Exam: Review of Systems  Cardiovascular: Negative for chest pain.  Neurological: Negative for tremors.  Psychiatric/Behavioral: Negative for depression and suicidal ideas.    Blood pressure 112/86, pulse 82, temperature 98 F (36.7 C), temperature source Oral, resp. rate 18, height 5\' 7"  (1.702 m), weight 88 kg (194 lb).Body mass index is 30.38 kg/m.  General Appearance: Casual  Eye Contact::  Fair  Speech:  Normal Rate409  Volume:  Increased  Mood:  Euthymic  Affect:  Full Range  Thought Process:  Goal Directed  Orientation:  Full (Time, Place, and Person)  Thought Content:  Logical  Suicidal Thoughts:  No  Homicidal Thoughts:  No  Memory:  Immediate;   Fair Recent;   Fair  Judgement:  Fair  Insight:  Shallow  Psychomotor Activity:  Increased  Concentration:  Fair  Recall:  Fis[employer]Fair  Fund of Knowledge:Fair  Language: Fair  Akathisia:  Negative  Handed:  Right  AIMS (if indicated):     Assets:  Desire for Improvement  Sleep:  Number of Hours: 6.5  Cognition: WNL  ADL's:  Intact   Mental Status Per Nursing Assessment::   On Admission:     Demographic Factors:  Adolescent or young adult and Low socioeconomic status  Loss Factors: Financial  problems/change in socioeconomic status  Historical Factors: Impulsivity  Risk Reduction Factors:   Living with another person, especially a relative and Positive social support  Continued Clinical Symptoms:  Alcohol/Substance Abuse/Dependencies Unstable or Poor Therapeutic Relationship Previous Psychiatric Diagnoses and Treatments  Cognitive Features That Contribute To Risk:  None    Suicide Risk:  Minimal: No identifiable suicidal ideation.  Patients presenting with no risk factors but with morbid ruminations; may be classified as minimal risk based on the severity of the depressive symptoms  Follow-up Information    [facility] Follow up on 02/27/2016.   Specialty:  Family Medicine Why:  Initial appointment for medications management w Ermalene Searingatherine Wigglesworth NP on Oct 26 at 2:30 PM  Please call to cancel/reschedule if needed.   Contact information: [address] [place] [address] [place] [address] [phone]          Follow up with recommendations and medication compliance stressed See discharge summary for details.  Plan Of Care/Follow-up recommendations:  Activity:  as tolerated Diet:  regular  Thresa RossAKHTAR, Kinberly Perris, MD 02/22/2016, 10:33 AM

## 2016-02-22 NOTE — Progress Notes (Signed)
Patient discharged to lobby. Patient was stable and appreciative at that time. All papers and prescriptions were given and valuables returned. Verbal understanding expressed. Denies SI/HI and A/VH. Patient given opportunity to express concerns and ask questions.  

## 2016-02-22 NOTE — Plan of Care (Signed)
Problem: Medication: Goal: Compliance with prescribed medication regimen will improve Outcome: Progressing Pt compliant with prescribed medication regime this evening

## 2016-02-24 NOTE — Progress Notes (Signed)
  [facility] :  Will you be returning to the same living situation after discharge:  Yes,  home At discharge, do you have transportation home?: Yes,  family, bus Do you have the ability to pay for your medications: Yes,  MCD  Release of information consent forms completed and in the chart;  Patient's signature needed at discharge.  Patient to Follow up at: Follow-up Information    [facility] Follow up on 02/27/2016.   Specialty:  Family Medicine Why:  Initial appointment for medications management w Ermalene Searingatherine Wigglesworth NP on Oct 26 at 2:30 PM  Please call to cancel/reschedule if needed.   Contact information: [address] [place] [address] [place] [address] [phone]           Next level of care provider [facility] Link:no  Safety Planning and Suicide Prevention discussed: Yes,  w pt  Have you used any form of tobacco in the last 30 days? (Cigarettes, Smokeless Tobacco, Cigars, and/or Pipes): Yes  Has patient been referred to the Quitline?: Patient refused referral  Patient has been referred for addiction treatment: Yes  Sallee Langenne C Cunningham 02/24/2016, 1:54 PM

## 2016-08-06 ENCOUNTER — Other Ambulatory Visit: Payer: Self-pay | Admitting: Specialist

## 2016-08-06 DIAGNOSIS — N644 Mastodynia: Secondary | ICD-10-CM

## 2016-08-06 DIAGNOSIS — N63 Unspecified lump in unspecified breast: Secondary | ICD-10-CM

## 2016-08-20 ENCOUNTER — Ambulatory Visit
Admission: RE | Admit: 2016-08-20 | Discharge: 2016-08-20 | Disposition: A | Discharge status: Home / Self Care | Payer: Medicaid Other | Source: Ambulatory Visit | Attending: Specialist | Admitting: Specialist

## 2016-08-20 DIAGNOSIS — N63 Unspecified lump in unspecified breast: Secondary | ICD-10-CM

## 2016-08-20 DIAGNOSIS — N644 Mastodynia: Secondary | ICD-10-CM

## 2017-10-06 ENCOUNTER — Institutional Professional Consult (permissible substitution): Payer: Self-pay | Admitting: Obstetrics & Gynecology

## 2017-11-03 ENCOUNTER — Institutional Professional Consult (permissible substitution): Payer: Self-pay | Admitting: Obstetrics & Gynecology

## 2017-11-08 ENCOUNTER — Ambulatory Visit: Payer: Self-pay | Admitting: Obstetrics & Gynecology

## 2018-02-10 ENCOUNTER — Ambulatory Visit: Payer: Medicaid Other | Admitting: Family Medicine

## 2018-03-09 ENCOUNTER — Ambulatory Visit: Payer: Self-pay | Admitting: Obstetrics & Gynecology

## 2018-03-09 ENCOUNTER — Ambulatory Visit (INDEPENDENT_AMBULATORY_CARE_PROVIDER_SITE_OTHER): Payer: Medicaid Other | Admitting: Obstetrics & Gynecology

## 2018-03-09 ENCOUNTER — Encounter: Payer: Self-pay | Admitting: Obstetrics & Gynecology

## 2018-03-09 VITALS — BP 112/75 | HR 72 | Ht 67.0 in | Wt 207.1 lb

## 2018-03-09 DIAGNOSIS — Z30432 Encounter for removal of intrauterine contraceptive device: Secondary | ICD-10-CM | POA: Diagnosis not present

## 2018-03-09 DIAGNOSIS — Z3202 Encounter for pregnancy test, result negative: Secondary | ICD-10-CM | POA: Diagnosis not present

## 2018-03-09 DIAGNOSIS — Z01812 Encounter for preprocedural laboratory examination: Secondary | ICD-10-CM

## 2018-03-09 DIAGNOSIS — T8332XA Displacement of intrauterine contraceptive device, initial encounter: Secondary | ICD-10-CM | POA: Diagnosis not present

## 2018-03-09 LAB — POCT URINE PREGNANCY: PREG TEST UR: NEGATIVE

## 2018-03-09 MED ORDER — KETOROLAC TROMETHAMINE 30 MG/ML IJ SOLN
30.0000 mg | Freq: Once | INTRAMUSCULAR | Status: AC
  Administered 2018-03-09: 30 mg via INTRAMUSCULAR

## 2018-03-09 NOTE — Progress Notes (Signed)
poc

## 2018-03-09 NOTE — Progress Notes (Signed)
INDICATIONS: 42 y.o. [id number]  here for scheduled surgery for hysteroscopy for retained IUD. Pt had 3 attempts to remove in the ofc prior referral.   Risks of surgery were discussed with the patient including but not limited to: bleeding which may require transfusion; infection which may require antibiotics; injury to uterus or surrounding organs; intrauterine scarring which may impair future fertility; need for additional procedures including laparotomy or laparoscopy; and other postoperative/anesthesia complications. Written informed consent was obtained.    ANESTHESIA:   General, paracervical block.  PROCEDURE DETAILS:  The patient was taken to the procedure room where she received Toradol 10 mg IM. She also took cytotec prior to the visit.  After an adequate timeout was performed, she was placed in the dorsal lithotomy position and examined; then prepped and draped in the sterile manner.   A speculum was then placed in the patient's vagina.  A paracervical block of 10cc of 2% lidocaine with epinephrine was placed with 5cc at both 5 and 7 o'clock.  A single tooth tenaculum was applied to the anterior lip of the cervix.   At this time the scope was noted to not have adequate light. After multiple attempts to fix it was discovered that there was a malfunction. The hysterscope was never inserted into the pt. An attempt was made to remove the IUD using an IUD extractor with no success. Pt declined US guided removal using the IUD extractor.   The tenaculum was removed from the anterior lip of the cervix and the vaginal speculum was removed after noting good hemostasis.  The patient tolerated the procedure well.   There were no immediate complications.  Pt requests to have the IUD removed in the OR under anesthesia.   Lindell Tussey L. Harraway-Smith, M.D., [place] [place]

## 2018-03-11 ENCOUNTER — Encounter (HOSPITAL_COMMUNITY): Payer: Self-pay

## 2018-03-11 ENCOUNTER — Encounter: Payer: Self-pay | Admitting: Obstetrics & Gynecology

## 2018-04-07 ENCOUNTER — Ambulatory Visit (HOSPITAL_COMMUNITY): Payer: Medicaid Other | Admitting: Certified Registered Nurse Anesthetist

## 2018-04-07 ENCOUNTER — Encounter (HOSPITAL_COMMUNITY)
Admission: RE | Disposition: A | Discharge status: Home / Self Care | Payer: Self-pay | Source: Ambulatory Visit | Attending: Obstetrics & Gynecology

## 2018-04-07 ENCOUNTER — Other Ambulatory Visit: Payer: Self-pay

## 2018-04-07 ENCOUNTER — Encounter (HOSPITAL_COMMUNITY): Payer: Self-pay | Admitting: Certified Registered Nurse Anesthetist

## 2018-04-07 ENCOUNTER — Ambulatory Visit (HOSPITAL_COMMUNITY)
Admission: RE | Admit: 2018-04-07 | Discharge: 2018-04-07 | Disposition: A | Discharge status: Home / Self Care | Payer: Medicaid Other | Source: Ambulatory Visit | Attending: Obstetrics & Gynecology | Admitting: Obstetrics & Gynecology

## 2018-04-07 DIAGNOSIS — Z79899 Other long term (current) drug therapy: Secondary | ICD-10-CM | POA: Insufficient documentation

## 2018-04-07 DIAGNOSIS — F1220 Cannabis dependence, uncomplicated: Secondary | ICD-10-CM | POA: Diagnosis not present

## 2018-04-07 DIAGNOSIS — T8332XD Displacement of intrauterine contraceptive device, subsequent encounter: Secondary | ICD-10-CM

## 2018-04-07 DIAGNOSIS — F17200 Nicotine dependence, unspecified, uncomplicated: Secondary | ICD-10-CM | POA: Insufficient documentation

## 2018-04-07 DIAGNOSIS — F4310 Post-traumatic stress disorder, unspecified: Secondary | ICD-10-CM | POA: Insufficient documentation

## 2018-04-07 DIAGNOSIS — T8389XA Other specified complication of genitourinary prosthetic devices, implants and grafts, initial encounter: Secondary | ICD-10-CM | POA: Insufficient documentation

## 2018-04-07 DIAGNOSIS — Y768 Miscellaneous obstetric and gynecological devices associated with adverse incidents, not elsewhere classified: Secondary | ICD-10-CM | POA: Diagnosis not present

## 2018-04-07 DIAGNOSIS — T8332XA Displacement of intrauterine contraceptive device, initial encounter: Secondary | ICD-10-CM

## 2018-04-07 DIAGNOSIS — F319 Bipolar disorder, unspecified: Secondary | ICD-10-CM | POA: Insufficient documentation

## 2018-04-07 HISTORY — PX: HYSTEROSCOPY WITH D & C: SHX1775

## 2018-04-07 LAB — CBC
HCT: 39.2 % (ref 36.0–46.0)
Hemoglobin: 12.7 g/dL (ref 12.0–15.0)
MCH: 28.9 pg (ref 26.0–34.0)
MCHC: 32.4 g/dL (ref 30.0–36.0)
MCV: 89.1 fL (ref 80.0–100.0)
PLATELETS: 267 10*3/uL (ref 150–400)
RBC: 4.4 MIL/uL (ref 3.87–5.11)
RDW: 13.8 % (ref 11.5–15.5)
WBC: 4.7 10*3/uL (ref 4.0–10.5)
nRBC: 0 % (ref 0.0–0.2)

## 2018-04-07 LAB — PREGNANCY, URINE: Preg Test, Ur: NEGATIVE

## 2018-04-07 SURGERY — DILATATION AND CURETTAGE /HYSTEROSCOPY
Anesthesia: General | Site: Vagina

## 2018-04-07 MED ORDER — ONDANSETRON HCL 4 MG/2ML IJ SOLN
INTRAMUSCULAR | Status: AC
  Filled 2018-04-07: qty 2

## 2018-04-07 MED ORDER — LACTATED RINGERS IV SOLN
INTRAVENOUS | Status: DC
  Administered 2018-04-07: 13:00:00 via INTRAVENOUS

## 2018-04-07 MED ORDER — MIDAZOLAM HCL 2 MG/2ML IJ SOLN
INTRAMUSCULAR | Status: AC
  Filled 2018-04-07: qty 2

## 2018-04-07 MED ORDER — FENTANYL CITRATE (PF) 100 MCG/2ML IJ SOLN
INTRAMUSCULAR | Status: AC
  Filled 2018-04-07: qty 2

## 2018-04-07 MED ORDER — OXYCODONE HCL 5 MG/5ML PO SOLN: 5.0000 mg | Freq: Once | ORAL | Status: DC | PRN

## 2018-04-07 MED ORDER — FENTANYL CITRATE (PF) 100 MCG/2ML IJ SOLN
INTRAMUSCULAR | Status: DC | PRN
  Administered 2018-04-07: 25 ug via INTRAVENOUS

## 2018-04-07 MED ORDER — BUPIVACAINE HCL (PF) 0.5 % IJ SOLN
INTRAMUSCULAR | Status: AC
  Filled 2018-04-07: qty 30

## 2018-04-07 MED ORDER — HYDROMORPHONE HCL 1 MG/ML IJ SOLN: 0.2500 mg | INTRAMUSCULAR | Status: DC | PRN

## 2018-04-07 MED ORDER — PROPOFOL 10 MG/ML IV BOLUS
INTRAVENOUS | Status: AC
  Filled 2018-04-07: qty 20

## 2018-04-07 MED ORDER — OXYCODONE HCL 5 MG PO TABS: 5.0000 mg | ORAL_TABLET | Freq: Once | ORAL | Status: DC | PRN

## 2018-04-07 MED ORDER — SCOPOLAMINE 1 MG/3DAYS TD PT72
1.0000 | MEDICATED_PATCH | Freq: Once | TRANSDERMAL | Status: DC
  Administered 2018-04-07: 1.5 mg via TRANSDERMAL

## 2018-04-07 MED ORDER — PROMETHAZINE HCL 25 MG/ML IJ SOLN: 6.2500 mg | INTRAMUSCULAR | Status: DC | PRN

## 2018-04-07 MED ORDER — KETOROLAC TROMETHAMINE 30 MG/ML IJ SOLN
INTRAMUSCULAR | Status: AC
  Filled 2018-04-07: qty 3

## 2018-04-07 MED ORDER — ONDANSETRON HCL 4 MG/2ML IJ SOLN
INTRAMUSCULAR | Status: DC | PRN
  Administered 2018-04-07: 4 mg via INTRAVENOUS

## 2018-04-07 MED ORDER — MIDAZOLAM HCL 5 MG/5ML IJ SOLN
INTRAMUSCULAR | Status: DC | PRN
  Administered 2018-04-07: 2 mg via INTRAVENOUS

## 2018-04-07 MED ORDER — SODIUM CHLORIDE 0.9 % IR SOLN
Status: DC | PRN
  Administered 2018-04-07: 3000 mL

## 2018-04-07 MED ORDER — SCOPOLAMINE 1 MG/3DAYS TD PT72
MEDICATED_PATCH | TRANSDERMAL | Status: AC
  Administered 2018-04-07: 1.5 mg via TRANSDERMAL
  Filled 2018-04-07: qty 1

## 2018-04-07 MED ORDER — BUPIVACAINE HCL (PF) 0.5 % IJ SOLN
INTRAMUSCULAR | Status: DC | PRN
  Administered 2018-04-07: 20 mL

## 2018-04-07 MED ORDER — PROPOFOL 10 MG/ML IV BOLUS
INTRAVENOUS | Status: DC | PRN
  Administered 2018-04-07: 200 mg via INTRAVENOUS

## 2018-04-07 MED ORDER — MEPERIDINE HCL 25 MG/ML IJ SOLN: 6.2500 mg | INTRAMUSCULAR | Status: DC | PRN

## 2018-04-07 MED ORDER — DEXAMETHASONE SODIUM PHOSPHATE 4 MG/ML IJ SOLN
INTRAMUSCULAR | Status: AC
  Filled 2018-04-07: qty 1

## 2018-04-07 SURGICAL SUPPLY — 10 items
CATH ROBINSON RED A/P 16FR (CATHETERS) ×3 IMPLANT
GLOVE BIO SURGEON STRL SZ7 (GLOVE) ×3 IMPLANT
GLOVE BIOGEL PI IND STRL 7.0 (GLOVE) ×2 IMPLANT
GLOVE BIOGEL PI INDICATOR 7.0 (GLOVE) ×4
GOWN STRL REUS W/TWL LRG LVL3 (GOWN DISPOSABLE) ×3 IMPLANT
GOWN STRL REUS W/TWL XL LVL3 (GOWN DISPOSABLE) ×3 IMPLANT
KIT PROCEDURE FLUENT (KITS) ×3 IMPLANT
PACK VAGINAL MINOR WOMEN LF (CUSTOM PROCEDURE TRAY) ×3 IMPLANT
PAD OB MATERNITY 4.3X12.25 (PERSONAL CARE ITEMS) ×3 IMPLANT
TOWEL OR 17X24 6PK STRL BLUE (TOWEL DISPOSABLE) ×6 IMPLANT

## 2018-04-07 NOTE — Anesthesia Preprocedure Evaluation (Signed)
Anesthesia Evaluation  Patient identified by MRN, date of birth, ID band Patient awake    Reviewed: Allergy & Precautions, NPO status , Patient's Chart, lab work & pertinent test results  Airway Mallampati: II  TM Distance: >3 FB Neck ROM: Full    Dental no notable dental hx.    Pulmonary neg pulmonary ROS, Current Smoker,    Pulmonary exam normal breath sounds clear to auscultation       Cardiovascular negative cardio ROS Normal cardiovascular exam Rhythm:Regular Rate:Normal     Neuro/Psych PSYCHIATRIC DISORDERS Anxiety Depression Bipolar Disorder Schizophrenia negative neurological ROS  negative psych ROS   GI/Hepatic negative GI ROS, Neg liver ROS,   Endo/Other  negative endocrine ROS  Renal/GU negative Renal ROS  negative genitourinary   Musculoskeletal negative musculoskeletal ROS (+)   Abdominal (+) + obese,   Peds negative pediatric ROS (+)  Hematology negative hematology ROS (+)   Anesthesia Other Findings   Reproductive/Obstetrics negative OB ROS                             Anesthesia Physical Anesthesia Plan  ASA: III  Anesthesia Plan: General   Post-op Pain Management:    Induction: Intravenous  PONV Risk Score and Plan: 2 and Ondansetron and Midazolam  Airway Management Planned: LMA  Additional Equipment:   Intra-op Plan:   Post-operative Plan: Extubation in OR  Informed Consent: I have reviewed the patients History and Physical, chart, labs and discussed the procedure including the risks, benefits and alternatives for the proposed anesthesia with the patient or authorized representative who has indicated his/her understanding and acceptance.   Dental advisory given  Plan Discussed with: CRNA  Anesthesia Plan Comments:         Anesthesia Quick Evaluation

## 2018-04-07 NOTE — Discharge Instructions (Signed)
Hysteroscopy, Care After °Refer to this sheet in the next few weeks. These instructions provide you with information on caring for yourself after your procedure. Your health care provider may also give you more specific instructions. Your treatment has been planned according to current medical practices, but problems sometimes occur. Call your health care provider if you have any problems or questions after your procedure. °What can I expect after the procedure? °After your procedure, it is typical to have the following: °· You may have some cramping. This normally lasts for a couple days. °· You may have bleeding. This can vary from light spotting for a few days to menstrual-like bleeding for 3-7 days. ° °Follow these instructions at home: °· Rest for the first 1-2 days after the procedure. °· Only take over-the-counter or prescription medicines as directed by your health care provider. Do not take aspirin. It can increase the chances of bleeding. °· Take showers instead of baths for 2 weeks or as directed by your health care provider. °· Do not drive for 24 hours or as directed. °· Do not drink alcohol while taking pain medicine. °· Do not use tampons, douche, or have sexual intercourse for 2 weeks or until your health care provider says it is okay. °· Take your temperature twice a day for 4-5 days. Write it down each time. °· Follow your health care provider's advice about diet, exercise, and lifting. °· If you develop constipation, you may: °? Take a mild laxative if your health care provider approves. °? Add bran foods to your diet. °? Drink enough fluids to keep your urine clear or pale yellow. °· Try to have someone with you or available to you for the first 24-48 hours, especially if you were given a general anesthetic. °· Follow up with your health care provider as directed. °Contact a health care provider if: °· You feel dizzy or lightheaded. °· You feel sick to your stomach (nauseous). °· You have  abnormal vaginal discharge. °· You have a rash. °· You have pain that is not controlled with medicine. °Get help right away if: °· You have bleeding that is heavier than a normal menstrual period. °· You have a fever. °· You have increasing cramps or pain, not controlled with medicine. °· You have new belly (abdominal) pain. °· You pass out. °· You have pain in the tops of your shoulders (shoulder strap areas). °· You have shortness of breath. °This information is not intended to replace advice given to you by your health care provider. Make sure you discuss any questions you have with your health care provider. °Document Released: 02/08/2013 Document Revised: 09/26/2015 Document Reviewed: 11/17/2012 °Elsevier Interactive Patient Education © 2017 [employer] ° ° °Post Anesthesia Home Care Instructions ° °Activity: °Get plenty of rest for the remainder of the day. A responsible individual must stay with you for 24 hours following the procedure.  °For the next 24 hours, DO NOT: °-Drive a car °-Operate machinery °-Drink alcoholic beverages °-Take any medication unless instructed by your physician °-Make any legal decisions or sign important papers. ° °Meals: °Start with liquid foods such as gelatin or soup. Progress to regular foods as tolerated. Avoid greasy, spicy, heavy foods. If nausea and/or vomiting occur, drink only clear liquids until the nausea and/or vomiting subsides. Call your physician if vomiting continues. ° °Special Instructions/Symptoms: °Your throat may feel dry or sore from the anesthesia or the breathing tube placed in your throat during surgery. If this causes discomfort, gargle   with warm salt water. The discomfort should disappear within 24 hours. ° °If you had a scopolamine patch placed behind your ear for the management of post- operative nausea and/or vomiting: ° °1. The medication in the patch is effective for 72 hours, after which it should be removed.  Wrap patch in a tissue and discard in  the trash. Wash hands thoroughly with soap and water. °2. You may remove the patch earlier than 72 hours if you experience unpleasant side effects which may include dry mouth, dizziness or visual disturbances. °3. Avoid touching the patch. Wash your hands with soap and water after contact with the patch. °  ° ° °

## 2018-04-07 NOTE — Op Note (Signed)
04/07/2018  2:04 PM  PATIENT:  Tami Miller  42 y.o. female  PRE-OPERATIVE DIAGNOSIS:  Retained IUd  POST-OPERATIVE DIAGNOSIS:  Retained IUD  PROCEDURE:  Procedure(s): DILATATION AND CURETTAGE /HYSTEROSCOPY  IUD Removal (N/A)  SURGEON:  Surgeon(s) and Role:    * Tami Miller, Tami Guion, MD - Primary  ANESTHESIA:   general and paracervical block  EBL:  2 mL   BLOOD ADMINISTERED:none  DRAINS: none   LOCAL MEDICATIONS USED:  MARCAINE     SPECIMEN:  Source of Specimen:  IUD  DISPOSITION OF SPECIMEN:  PATHOLOGY  COUNTS:  YES  TOURNIQUET:  * No tourniquets in log *  DICTATION: .Note written in EPIC  PLAN OF CARE: Discharge to home after PACU  PATIENT DISPOSITION:  PACU - hemodynamically stable.   Delay start of Pharmacological VTE agent (>24hrs) due to surgical blood loss or risk of bleeding: not applicable  Complications: none immediate.  INDICATIONS: 42 y.o. [id number]  here for scheduled surgery for retained IUD.  Pt was s/p multiple unsuccessful attempts for removal in the office.  Risks of surgery were discussed with the patient including but not limited to: bleeding which may require transfusion; infection which may require antibiotics; injury to uterus or surrounding organs. Written informed consent was obtained.    FINDINGS:  A small uterus.  Diffuse proliferative endometrium.  Normal ostia bilaterally.  IUD noted in the proper position in the endometrial cavity.   PROCEDURE DETAILS:  The patient was taken to the operating room where general anesthesia was administered and was found to be adequate.  After a timeout was performed, she was placed in the dorsal lithotomy position and examined; then prepped and draped in the sterile manner.   Her bladder was catheterized for an unmeasured amount of clear, yellow urine. A speculum was then placed in the patient's vagina and a single tooth tenaculum was applied to the anterior lip of the cervix.   The cervix was dilated  manually with metal dilators to accommodate the 5 mm diagnostic hysteroscope.  Once the cervix was dilated, the hysteroscope was inserted under direct visualization using 1.5% glycine as a suspension medium.  The uterine cavity was carefully examined, both ostia were recognized, and diffusely proliferative endometrium was noted.  The IUD was noted in the endometrial cavity. Using grasping forceps, the IUD strings were grasped and the IUD was removed without difficulty. The hysteroscope and the tenaculum were removed from the anterior lip of the cervix and the vaginal speculum was removed after noting good hemostasis.  The patient tolerated the procedure well and was taken to the recovery area awake, extubated and in stable condition.  The patient will be discharged to home as per PACU criteria.  Routine postoperative instructions given.  She will follow up in the clinic in 4 weeks for postoperative evaluation.  Tami Miller, M.D., Tami Miller

## 2018-04-07 NOTE — Brief Op Note (Signed)
04/07/2018  2:04 PM  PATIENT:  Tami Miller  42 y.o. female  PRE-OPERATIVE DIAGNOSIS:  Retained IUd  POST-OPERATIVE DIAGNOSIS:  Retained IUD  PROCEDURE:  Procedure(s): DILATATION AND CURETTAGE /HYSTEROSCOPY  IUD Removal (N/A)  SURGEON:  Surgeon(s) and Role:    * Willodean RosenthalHarraway-Smith, Marinda Tyer, MD - Primary  ANESTHESIA:   general and paracervical block  EBL:  2 mL   BLOOD ADMINISTERED:none  DRAINS: none   LOCAL MEDICATIONS USED:  MARCAINE     SPECIMEN:  Source of Specimen:  IUD  DISPOSITION OF SPECIMEN:  PATHOLOGY  COUNTS:  YES  TOURNIQUET:  * No tourniquets in log *  DICTATION: .Note written in EPIC  PLAN OF CARE: Discharge to home after PACU  PATIENT DISPOSITION:  PACU - hemodynamically stable.   Delay start of Pharmacological VTE agent (>24hrs) due to surgical blood loss or risk of bleeding: not applicable  Complications: none immediate.  Tami Miller L. Harraway-Smith, M.D., [facility] CoreFACOG

## 2018-04-07 NOTE — H&P (Signed)
Preoperative History and Physical  Tami Miller is a 42 y.o. [phone] here for surgical management of retained IUD.   Proposed surgery: hysteroscopy with removal of IUD  Past Medical History:  Diagnosis Date  . Bipolar affective disorder (HCC)   . Bipolar disorder, curr episode depressed, severe, w/psychotic features (HCC)   . Depression    History reviewed. No pertinent surgical history. OB History    Gravida  3   Para  3   Term  2   Preterm  1   AB      Living  3     SAB      TAB      Ectopic      Multiple      Live Births  3          Patient denies any cervical dysplasia or STIs. Medications Prior to Admission  Medication Sig Dispense Refill Last Dose  . divalproex (DEPAKOTE ER) 250 MG 24 hr tablet Take 3 tablets (750 mg total) by mouth at bedtime. 90 tablet 0   . hydrOXYzine (ATARAX/VISTARIL) 25 MG tablet Take 1 tablet (25 mg total) by mouth every 6 (six) hours as needed for anxiety. 30 tablet 0   . hydrOXYzine (ATARAX/VISTARIL) 50 MG tablet Take 1 tablet (50 mg total) by mouth at bedtime as needed (sleep). 30 tablet 0   . lurasidone (LATUDA) 80 MG TABS tablet Take 1 tablet (80 mg total) by mouth daily with supper. 30 tablet 0   . mirtazapine (REMERON) 15 MG tablet Take 1 tablet (15 mg total) by mouth at bedtime. 30 tablet 0   . thiamine 100 MG tablet Take 1 tablet (100 mg total) by mouth daily. 30 tablet 0   . venlafaxine XR (EFFEXOR-XR) 37.5 MG 24 hr capsule Take 1 capsule (37.5 mg total) by mouth daily with breakfast. 30 capsule 0     No Known Allergies Social History:   reports that she has been smoking. She has a 0.25 pack-year smoking history. She has never used smokeless tobacco. She reports that she drinks alcohol. She reports that she has current or past drug history. Drug: Marijuana. Family History  Problem Relation Age of Onset  . Bipolar disorder Mother   . Breast cancer Maternal Grandmother   . Breast cancer Maternal Aunt     Review of  Systems: Noncontributory  PHYSICAL EXAM: There were no vitals taken for this visit. General appearance - alert, well appearing, and in no distress Chest - clear to auscultation, no wheezes, rales or rhonchi, symmetric air entry Heart - normal rate and regular rhythm Abdomen - soft, nontender, nondistended, no masses or organomegaly Pelvic - examination not indicated Extremities - peripheral pulses normal, no pedal edema, no clubbing or cyanosis  Labs:  Imaging Studies: No results found.  Assessment: Patient Active Problem List   Diagnosis Date Noted  . Bipolar disorder, curr episode depressed, severe, w/psychotic features (HCC) 02/19/2016  . Bereavement 02/19/2016  . PTSD (post-traumatic stress disorder) 02/19/2016  . Alcohol use disorder, moderate, dependence (HCC) 02/19/2016  . Cannabis use disorder, moderate, dependence (HCC) 02/19/2016    Plan: Patient will undergo surgical management with hysteroscopy with removal of IUD.   The risks of surgery were discussed in detail with the patient including but not limited to: bleeding which may require transfusion or reoperation; infection which may require antibiotics; injury to surrounding organs which may involve bowel, bladder, ureters ; need for additional procedures including laparoscopy or laparotomy; thromboembolic phenomenon, surgical  site problems and other postoperative/anesthesia complications. Likelihood of success in alleviating the patient's condition was discussed. Routine postoperative instructions will be reviewed with the patient and her family in detail after surgery.  The patient concurred with the proposed plan, giving informed written consent for the surgery.  Patient has been NPO since last night she will remain NPO for procedure.  Anesthesia and OR aware.  Preoperative prophylactic antibiotics and SCDs ordered on call to the OR.  To OR when ready.  Gaelan Glennon L. Erin Fulling, M.D., [facility] 04/07/2018 12:46 PM

## 2018-04-07 NOTE — Anesthesia Procedure Notes (Signed)
Procedure Name: LMA Insertion Date/Time: 04/07/2018 1:28 PM Performed by: [place], Mirha Brucato C, CRNA Pre-anesthesia Checklist: Patient identified, Emergency Drugs available, Suction available and Patient being monitored Patient Re-evaluated:Patient Re-evaluated prior to induction Oxygen Delivery Method: Circle system utilized Preoxygenation: Pre-oxygenation with 100% oxygen Induction Type: IV induction Ventilation: Mask ventilation without difficulty LMA: LMA inserted LMA Size: 4.0 Number of attempts: 1 Airway Equipment and Method: Bite block Placement Confirmation: positive ETCO2 Tube secured with: Tape Dental Injury: Teeth and Oropharynx as per pre-operative assessment

## 2018-04-07 NOTE — Anesthesia Postprocedure Evaluation (Signed)
Anesthesia Post Note  Patient: Tami Miller  Procedure(s) Performed: DILATATION AND CURETTAGE /HYSTEROSCOPY  IUD Removal (N/A Vagina )     Patient location during evaluation: PACU Anesthesia Type: General Level of consciousness: awake and alert Pain management: pain level controlled Vital Signs Assessment: post-procedure vital signs reviewed and stable Respiratory status: spontaneous breathing, nonlabored ventilation and respiratory function stable Cardiovascular status: blood pressure returned to baseline and stable Postop Assessment: no apparent nausea or vomiting Anesthetic complications: no    Last Vitals:  Vitals:   04/07/18 1500 04/07/18 1530  BP: 110/85 118/81  Pulse: 73 75  Resp: 17 16  Temp:  36.5 C  SpO2: 99% 100%    Last Pain:  Vitals:   04/07/18 1530  TempSrc:   PainSc: 0-No pain   Pain Goal: Patients Stated Pain Goal: 6 (04/07/18 1530)               Lowella CurbWarren Ray Miller

## 2018-04-07 NOTE — Transfer of Care (Signed)
Immediate Anesthesia Transfer of Care Note  Patient: Tami HarmsLillyan S Andreen  Procedure(s) Performed: DILATATION AND CURETTAGE /HYSTEROSCOPY  IUD Removal (N/A Vagina )  Patient Location: PACU  Anesthesia Type:General  Level of Consciousness: awake and drowsy  Airway & Oxygen Therapy: Patient Spontanous Breathing and Patient connected to nasal cannula oxygen  Post-op Assessment: Report given to RN and Post -op Vital signs reviewed and stable  Post vital signs: Reviewed and stable  Last Vitals:  Vitals Value Taken Time  BP    Temp    Pulse    Resp    SpO2      Last Pain:  Vitals:   04/07/18 1300  TempSrc: Oral  PainSc: 0-No pain      Patients Stated Pain Goal: 6 (04/07/18 1300)  Complications: No apparent anesthesia complications

## 2018-04-08 ENCOUNTER — Encounter (HOSPITAL_COMMUNITY): Payer: Self-pay | Admitting: Obstetrics & Gynecology

## 2019-04-13 ENCOUNTER — Encounter: Payer: Self-pay | Admitting: Obstetrics & Gynecology

## 2019-04-13 ENCOUNTER — Other Ambulatory Visit: Payer: Self-pay

## 2019-04-13 ENCOUNTER — Ambulatory Visit (INDEPENDENT_AMBULATORY_CARE_PROVIDER_SITE_OTHER): Payer: Medicaid Other | Admitting: Obstetrics & Gynecology

## 2019-04-13 VITALS — BP 110/74 | HR 76 | Ht 67.0 in | Wt 194.0 lb

## 2019-04-13 DIAGNOSIS — Z3009 Encounter for other general counseling and advice on contraception: Secondary | ICD-10-CM | POA: Diagnosis not present

## 2019-04-13 DIAGNOSIS — Z1239 Encounter for other screening for malignant neoplasm of breast: Secondary | ICD-10-CM

## 2019-04-13 DIAGNOSIS — L700 Acne vulgaris: Secondary | ICD-10-CM

## 2019-04-13 DIAGNOSIS — Z0000 Encounter for general adult medical examination without abnormal findings: Secondary | ICD-10-CM | POA: Diagnosis not present

## 2019-04-13 DIAGNOSIS — Z01419 Encounter for gynecological examination (general) (routine) without abnormal findings: Secondary | ICD-10-CM

## 2019-04-13 LAB — POCT URINE PREGNANCY: Preg Test, Ur: NEGATIVE

## 2019-04-13 MED ORDER — NORETHIN ACE-ETH ESTRAD-FE 1-20 MG-MCG(24) PO TABS: 1.0000 | ORAL_TABLET | Freq: Every day | ORAL | 11 refills | Status: DC

## 2019-04-13 NOTE — Patient Instructions (Signed)
Acne  Acne is a skin problem that causes pimples and other skin changes. The skin has many tiny openings called pores. Each pore contains an oil gland. Oil glands make an oily substance that is called sebum. Acne occurs when the pores in the skin get blocked. The pores may become infected with bacteria, or they may become red, sore, and swollen. Acne is a common skin problem, especially for teenagers. It often occurs on the face, neck, chest, upper arms, and back. Acne usually goes away over time. What are the causes? Acne is caused when oil glands get blocked with sebum, dead skin cells, and dirt. The bacteria that are normally found in the oil glands then multiply and cause inflammation. Acne is commonly triggered by changes in your hormones. These hormonal changes can cause the oil glands to get bigger and to make more sebum. Factors that can make acne worse include:  Hormone changes during: ? Adolescence. ? Women's menstrual cycles. ? Pregnancy.  Oil-based cosmetics and hair products.  Stress.  Hormone problems that are caused by certain diseases.  Certain medicines.  Pressure from headbands, backpacks, or shoulder pads.  Exposure to certain oils and chemicals.  Eating a diet high in carbohydrates that quickly turn to sugar. These include dairy products, desserts, and chocolates. What increases the risk? This condition is more likely to develop in:  Teenagers.  People who have a family history of acne. What are the signs or symptoms? Symptoms include:  Small, red bumps (pimples or papules).  Whiteheads.  Blackheads.  Small, pus-filled pimples (pustules).  Big, red pimples or pustules that feel tender. More severe acne can cause:  An abscess. This is an infected area that contains a collection of pus.  Cysts. These are hard, painful, fluid-filled sacs.  Scars. These can happen after large pimples heal. How is this diagnosed? This condition is diagnosed with a  medical history and physical exam. Blood tests may also be done. How is this treated? Treatment for this condition can vary depending on the severity of your acne. Treatment may include:  Creams and lotions that prevent oil glands from clogging.  Creams and lotions that treat or prevent infections and inflammation.  Antibiotic medicines that are applied to the skin or taken as a pill.  Pills that decrease sebum production.  Birth control pills.  Light or laser treatments.  Injections of medicine into the affected areas.  Chemicals that cause peeling of the skin.  Surgery. Your health care provider will also recommend the best way to take care of your skin. Good skin care is the most important part of treatment. Follow these instructions at home: Skin care Take care of your skin as told by your health care provider. You may be told to do these things:  Wash your skin gently at least two times each day, as well as: ? After you exercise. ? Before you go to bed.  Use mild soap.  Apply a water-based skin moisturizer after you wash your skin.  Use a sunscreen or sunblock with SPF 30 or greater. This is especially important if you are using acne medicines.  Choose cosmetics that will not block your oil glands (are noncomedogenic). Medicines  Take over-the-counter and prescription medicines only as told by your health care provider.  If you were prescribed an antibiotic medicine, apply it or take it as told by your health care provider. Do not stop using the antibiotic even if your condition improves. General instructions  Keep your   hair clean and off your face. If you have oily hair, shampoo your hair regularly or daily.  Avoid wearing tight headbands or hats.  Avoid picking or squeezing your pimples. That can make your acne worse and cause scarring.  Shave gently and only when necessary.  Keep a food journal to figure out if any foods are linked to your acne. Avoid dairy  products, desserts, and chocolates.  Take steps to manage and reduce stress.  Keep all follow-up visits as told by your health care provider. This is important. Contact a health care provider if:  Your acne is not better after eight weeks.  Your acne gets worse.  You have a large area of skin that is red or tender.  You think that you are having side effects from any acne medicine. Summary  Acne is a skin problem that causes pimples and other skin changes. Acne is a common skin problem, especially for teenagers. Acne usually goes away over time.  Acne is commonly triggered by changes in your hormones. There are many other causes, such as stress, diet, and certain medicines.  Follow your health care provider's instructions for how to take care of your skin. Good skin care is the most important part of treatment.  Take over-the-counter and prescription medicines only as told by your health care provider.  Contact your health care provider if you think that you are having side effects from any acne medicine. This information is not intended to replace advice given to you by your health care provider. Make sure you discuss any questions you have with your health care provider. Document Released: 04/17/2000 Document Revised: 08/31/2017 Document Reviewed: 08/31/2017 Elsevier Patient Education  2020 [employer]  

## 2019-04-13 NOTE — Progress Notes (Signed)
Subjective:    Tami Miller is a 43 y.o. female here for a routine exam.  Current complaints: acne.    Patient presenting today with non-pertinent past medical history to discuss birth control. She desires birth control for better acne control as she believes she has "hormonal imbalance".  Previous contraceptive method was IUD which was replaced last year (2019). Since, she has not used any other contraceptive method as she has been abstinent the past 5 years. Patient requests OCPs, as they have previously helped her acne symptoms and had no associated side effects.  Otherwise, patient reports regular menstrual status. She has 4-5 days of bleeding in. with moderate flow. She denies any other associated menstrual symptoms.    Gynecologic History Patient's last menstrual period was 04/01/2019. Contraception: abstinence Last Pap: 2019 (by health department). Results were: normal Last mammogram: 2018 Results: No evidence of malignancy, Region of dense fibrous breast tissue within the left breast breast at the 2 o'clock axis corresponding to palpable lump  Obstetric History OB History  Gravida Para Term Preterm AB Living  3 3 2 1   3   SAB TAB Ectopic Multiple Live Births          3    # Outcome Date GA Lbr Len/2nd Weight Sex Delivery Anes PTL Lv  3 Term 2011 [redacted]w[redacted]d   M Vag-Spont EPI  LIV  2 Preterm 1999 [redacted]w[redacted]d   F Vag-Spont None  LIV  1 Term 58 [redacted]w[redacted]d   F Vag-Spont None  LIV     The following portions of the patient's history were reviewed and updated as appropriate: allergies, current medications, past family history, past medical history, past social history, past surgical history and problem list.  Review of Systems Pertinent items are noted in HPI.    Objective:    BP 110/74   Pulse 76   Ht 5\' 7"  (1.702 m)   Wt 194 lb (88 kg)   LMP 04/01/2019   BMI 30.38 kg/m   General Appearance:    Alert, cooperative, no distress, appears stated age  Head:    Normocephalic, without  obvious abnormality, atraumatic  Eyes:    PERRL, conjunctiva/corneas clear, EOM's intact, fundi    benign, both eyes  Ears:    Normal TM's and external ear canals, both ears  Nose:   Nares normal, septum midline, mucosa normal, no drainage    or sinus tenderness  Throat:   Lips, mucosa, and tongue normal; teeth and gums normal  Neck:   Supple, symmetrical, trachea midline, no adenopathy;    thyroid:  no enlargement/tenderness/nodules; no carotid   bruit or JVD  Back:     Symmetric, no curvature, ROM normal, no CVA tenderness  Lungs:     Clear to auscultation bilaterally, respirations unlabored  Chest Wall:    No tenderness or deformity   Heart:    Regular rate and rhythm, S1 and S2 normal, no murmur, rub   or gallop  Breast Exam:    No tenderness, masses, or nipple abnormality  Abdomen:     Soft, non-tender, bowel sounds active all four quadrants,    no masses, no organomegaly  Genitalia:    Normal female without lesion, discharge or tenderness  Extremities:   Extremities normal, atraumatic, no cyanosis or edema  Pulses:   2+ and symmetric all extremities  Skin:   Acne scarring noted on forehead/cheeks/nose, Skin color, texture, turgor normal, no rashes or lesions  Lymph nodes:   Cervical, supraclavicular, and axillary  nodes normal  Neurologic:   CNII-XII intact, normal strength, sensation and reflexes    throughout          Assessment/Plan:   1. Well female exam with routine gynecological exam Patient doing well. Pap smear performed by health department in 2019. Results were normal. No family history of uterine/ovarian cancer.    2. Acne vulgaris Patient reports history of pustular acne with observed acne scarring observed on exam. Patient states that previously her acne symptoms were better controlled while using OCPs - Norethindrone Acetate-Ethinyl Estrad-FE (LOESTRIN 24 FE) 1-20 MG-MCG(24) tablet; Take 1 tablet by mouth daily.  Dispense: 1 Package; Refill: 11 -Counseled  patient regarding topical benzoate creams/washes. Patient would like to try hormonal OCP methods with her current skin care regimen and revisit additional measures at follow up visit in 3 months.   3. Encounter for counseling regarding contraception Previous contraception method (IUD) removed in 2019. Has utilized abstinence as additional contraception for past 5 years. Requests OCPs for hormonal control of acne as opposed to birth control methods - POCT urine pregnancy - Norethindrone Acetate-Ethinyl Estrad-FE (LOESTRIN 24 FE) 1-20 MG-MCG(24) tablet; Take 1 tablet by mouth daily.  Dispense: 1 Package; Refill: 11  4. Breast cancer screening other than mammogram Previous mammogram in 2018 revealed region of dense breast tissue within the left breast at the 2 o'clock axis without evidence of malignancy. At the time, repeat mammography was recommended at age 85. Patient is now 6 and due for repeat mammogram. Additioanlly patient endorses family history of breast cancer in maternal grandmother and maternal aunt.  - Mammogram Screening Routine; Future   Follow up in: 3 months for recheck of acne or as needed.   Lora Havens, Medical Student 3:51 PM  I confirm that I have verified and agree with the information documented in the students note. I have edited within the note.   Willodean Rosenthal, MD 04/13/2019 4:44 PM  Eber Jones L. Harraway-Smith, M.D., [place]

## 2019-05-16 ENCOUNTER — Ambulatory Visit (HOSPITAL_BASED_OUTPATIENT_CLINIC_OR_DEPARTMENT_OTHER): Payer: Medicaid Other

## 2020-05-13 ENCOUNTER — Other Ambulatory Visit: Payer: Self-pay

## 2020-05-13 ENCOUNTER — Other Ambulatory Visit (HOSPITAL_COMMUNITY)
Admission: RE | Admit: 2020-05-13 | Discharge: 2020-05-13 | Disposition: A | Discharge status: Home / Self Care | Payer: Medicaid Other | Source: Ambulatory Visit | Attending: Obstetrics & Gynecology | Admitting: Obstetrics & Gynecology

## 2020-05-13 ENCOUNTER — Ambulatory Visit (INDEPENDENT_AMBULATORY_CARE_PROVIDER_SITE_OTHER): Payer: Medicaid Other | Admitting: Obstetrics & Gynecology

## 2020-05-13 ENCOUNTER — Encounter: Payer: Self-pay | Admitting: Obstetrics & Gynecology

## 2020-05-13 VITALS — BP 126/85 | HR 74 | Wt 195.0 lb

## 2020-05-13 DIAGNOSIS — Z01419 Encounter for gynecological examination (general) (routine) without abnormal findings: Secondary | ICD-10-CM | POA: Diagnosis not present

## 2020-05-13 DIAGNOSIS — Z3009 Encounter for other general counseling and advice on contraception: Secondary | ICD-10-CM | POA: Diagnosis not present

## 2020-05-13 DIAGNOSIS — Z1239 Encounter for other screening for malignant neoplasm of breast: Secondary | ICD-10-CM

## 2020-05-13 DIAGNOSIS — Z0000 Encounter for general adult medical examination without abnormal findings: Secondary | ICD-10-CM

## 2020-05-13 MED ORDER — LEVONORGEST-ETH ESTRAD 91-DAY 0.15-0.03 MG PO TABS: 1.0000 | ORAL_TABLET | Freq: Every day | ORAL | 4 refills | Status: DC

## 2020-05-13 NOTE — Progress Notes (Signed)
Subjective:     Tami Miller is a 45 y.o. female here for a routine exam.  Current complaints: Pt on OCPs but, is still having menses. She has tried the continuous but she runs out of pills. She does not want to reconsider the IUD due to so many problems. She denies eh     Gynecologic History Patient's last menstrual period was 05/08/2020. Contraception: OCP (estrogen/progesterone) Last Pap: >5 year prev. Results were: normal Last mammogram: 08/20/2016. Results were: birad 2  Obstetric History OB History  Gravida Para Term Preterm AB Living  3 3 2 1   3   SAB IAB Ectopic Multiple Live Births          3    # Outcome Date GA Lbr Len/2nd Weight Sex Delivery Anes PTL Lv  3 Term 2011 [redacted]w[redacted]d   M Vag-Spont EPI  LIV  2 Preterm 1999 [redacted]w[redacted]d   F Vag-Spont None  LIV  1 Term 67 [redacted]w[redacted]d   F Vag-Spont None  LIV   The following portions of the patient's history were reviewed and updated as appropriate: allergies, current medications, past family history, past medical history, past social history, past surgical history and problem list.  Review of Systems Pertinent items are noted in HPI.    Objective:    BP 126/85   Pulse 74   Wt 195 lb (88.5 kg)   LMP 05/08/2020   BMI 30.54 kg/m  General Appearance:    Alert, cooperative, no distress, appears stated age  Head:    Normocephalic, without obvious abnormality, atraumatic  Eyes:    conjunctiva/corneas clear, EOM's intact, both eyes  Ears:    Normal external ear canals, both ears  Nose:   Nares normal, septum midline, mucosa normal, no drainage    or sinus tenderness  Throat:   Lips, mucosa, and tongue normal; teeth and gums normal  Neck:   Supple, symmetrical, trachea midline, no adenopathy;    thyroid:  no enlargement/tenderness/nodules  Back:     Symmetric, no curvature, ROM normal, no CVA tenderness  Lungs:     respirations unlabored  Chest Wall:    No tenderness or deformity   Heart:    Regular rate and rhythm  Breast Exam:    No  tenderness, masses, or nipple abnormality  Abdomen:     Soft, non-tender, bowel sounds active all four quadrants,    no masses, no organomegaly  Genitalia:    Normal female without lesion, discharge or tenderness     Extremities:   Extremities normal, atraumatic, no cyanosis or edema  Pulses:   2+ and symmetric all extremities  Skin:   Skin color, texture, turgor normal, no rashes or lesions    Assessment:    Healthy female exam.   Contraception counseling. Reviewed contraception options. Pt would like to try the 3 month continuous.     Plan:   Tami Miller was seen today for gynecologic exam.  Diagnoses and all orders for this visit:  Well female exam with routine gynecological exam -     Cytology - PAP( )  Encounter for counseling regarding contraception -     levonorgestrel-ethinyl estradiol (SEASONALE) 0.15-0.03 MG tablet; Take 1 tablet by mouth daily.  Breast cancer screening other than mammogram -     MM DIGITAL SCREENING BILATERAL; Future  f/u in 1 year or sooner prn  Briahna Pescador L. Harraway-Smith, M.D., 11-09-1994

## 2020-05-13 NOTE — Patient Instructions (Signed)
Levonorgestrel; Ethinyl Estradiol Tablets What is this medicine? LEVONORGESTREL; ETHINYL ESTRADIOL (LEE voh nor jes trel; ETH in il es tra DYE ole) is an oral contraceptive. The products combine two types of female hormones, an estrogen and a progestin. These products prevent ovulation and pregnancy. This medicine may be used for other purposes; ask your health care provider or pharmacist if you have questions. COMMON BRAND NAME(S): Afirm[place], A[place]se, [place], Amethia, Amethia Lo, [place], [place], [place], [place]ra-28, Aviane, Camrese, Camrese Lo, [place], [place], [place], [place], [place], [place], [place], [place], [place], [place], [place], [place], [employer], [place], [place], [place], [place], Levonorgestrel/Ethinyl Estradiol, [place], [place], [place], [place], [place], [place], [place], [place], [place], [place], [place], [place], [place], [place], [place], [place], [place], [place], [place], [place], [place], [place] What should I tell my health care provider before I take this medicine? They need to know if you have or ever had any of these conditions:  abnormal vaginal bleeding  blood vessel disease or blood clots  breast, cervical, endometrial, ovarian, liver, or uterine cancer  diabetes  gallbladder disease  having surgery  heart disease or recent heart attack  high blood pressure  high cholesterol or triglycerides  history of irregular heartbeat or heart valve problems  kidney disease  liver disease  migraine headaches  protein C deficiency  protein S deficiency  recently had a baby, miscarriage, or abortion  stroke  systemic lupus erythematosus (SLE)  tobacco smoker  an unusual or allergic reaction to estrogens, progestins, other medicines, foods, dyes, or preservatives  pregnant or trying to get pregnant  breast-feeding How should I use this medicine? Take this medicine by mouth. To reduce nausea, this medicine may be  taken with food. Follow the directions on the prescription label. Take this medicine at the same time each day and in the order directed on the package. Do not take your medicine more often than directed. Contact your pediatrician regarding the use of this medicine in children. Special care may be needed. This medicine has been used in female children who have started having menstrual periods. A patient package insert for the product will be given with each prescription and refill. Read this sheet carefully each time. The sheet may change frequently. Overdosage: If you think you have taken too much of this medicine contact a poison control center or emergency room at once. NOTE: This medicine is only for you. Do not share this medicine with others. What if I miss a dose? If you miss a dose, refer to the patient information sheet you received with your medicine for direction. If you miss more than one pill, this medicine may not be as effective and you may need to use another form of birth control. What may interact with this medicine? Do not take this medicine with the following medication:  dasabuvir; ombitasvir; paritaprevir; ritonavir  ombitasvir; paritaprevir; ritonavir This medicine may also interact with the following medications:  acetaminophen  antibiotics or medicines for infections, especially rifampin, rifabutin, rifapentine, and griseofulvin, and possibly penicillins or tetracyclines  aprepitant  ascorbic acid (vitamin C)  atorvastatin  barbiturate medicines, such as phenobarbital  bosentan  carbamazepine  caffeine  clofibrate  cyclosporine  dantrolene  doxercalciferol  felbamate  grapefruit juice  hydrocortisone  medicines for anxiety or sleeping problems, such as diazepam or temazepam  medicines for diabetes, including pioglitazone  mineral oil  modafinil  mycophenolate  nefazodone  oxcarbazepine  phenytoin  prednisolone  ritonavir or  other medicines for HIV infection or AIDS  rosuvastatin  selegiline  soy isoflavones supplements  St. John's wort  tamoxifen or raloxifene  theophylline  thyroid hormones  topiramate  warfarin This list may not describe all possible interactions. Give your health care provider a list of all the medicines, herbs, non-prescription drugs, or dietary supplements you use. Also tell them if you smoke, drink alcohol, or use illegal drugs. Some items may interact with your medicine. What should I watch for while using this medicine? Visit your doctor or health care professional for regular checks on your progress. You will need a regular breast and pelvic exam and Pap smear while on this medicine. Use an additional method of contraception during the first cycle that you take these tablets. If you have any reason to think you are pregnant, stop taking this medicine right away and contact your doctor or health care professional. If you are taking this medicine for hormone related problems, it may take several cycles of use to see improvement in your condition. Smoking increases the risk of getting a blood clot or having a stroke while you are taking birth control pills, especially if you are more than 43 years old. You are strongly advised not to smoke. This medicine can make your body retain fluid, making your fingers, hands, or ankles swell. Your blood pressure can go up. Contact your doctor or health care professional if you feel you are retaining fluid. This medicine can make you more sensitive to the sun. Keep out of the sun. If you cannot avoid being in the sun, wear protective clothing and use sunscreen. Do not use sun lamps or tanning beds/booths. If you wear contact lenses and notice visual changes, or if the lenses begin to feel uncomfortable, consult your eye care specialist. In some women, tenderness, swelling, or minor bleeding of the gums may occur. Notify your dentist if this  happens. Brushing and flossing your teeth regularly may help limit this. See your dentist regularly and inform your dentist of the medicines you are taking. If you are going to have elective surgery, you may need to stop taking this medicine before the surgery. Consult your health care professional for advice. This medicine does not protect you against HIV infection (AIDS) or any other sexually transmitted diseases. What side effects may I notice from receiving this medicine? Side effects that you should report to your doctor or health care professional as soon as possible:  allergic reactions such as skin rash or itching, hives, swelling of the lips, mouth, tongue, or throat  breast tissue changes or discharge  dark patches of skin on your forehead, cheeks, upper lip, and chin  depression  high blood pressure  migraines or severe, sudden headaches  signs and symptoms of a blood clot such as breathing problems; changes in vision; chest pain; severe, sudden headache; pain, swelling, warmth in the leg; trouble speaking; sudden numbness or weakness of the face, arm or leg  stomach pain  symptoms of vaginal infection like itching, irritation or unusual discharge  yellowing of the eyes or skin Side effects that usually do not require medical attention (report these to your doctor or health care professional if they continue or are bothersome):  acne  breast pain, tenderness  irregular vaginal bleeding or spotting, particularly during the first month of use  mild headache  nausea  weight gain (slight) This list may not describe all possible side effects. Call your doctor for medical advice about side effects. You may report side effects to FDA at 1-800-FDA-1088. Where should I keep my medicine? Keep out  of the reach of children. Store at room temperature between 15 and 30 degrees C (59 and 86 degrees F). Throw away any unused medicine after the expiration date. NOTE: This sheet is  a summary. It may not cover all possible information. If you have questions about this medicine, talk to your doctor, pharmacist, or health care provider.  2021 Elsevier/Gold Standard (2020-03-11 10:40:59)

## 2020-05-15 LAB — CYTOLOGY - PAP
Comment: NEGATIVE
Diagnosis: NEGATIVE
High risk HPV: NEGATIVE

## 2020-06-11 ENCOUNTER — Ambulatory Visit (HOSPITAL_BASED_OUTPATIENT_CLINIC_OR_DEPARTMENT_OTHER)
Admission: RE | Admit: 2020-06-11 | Discharge: 2020-06-11 | Disposition: A | Discharge status: Home / Self Care | Payer: Medicaid Other | Source: Ambulatory Visit | Attending: Obstetrics & Gynecology | Admitting: Obstetrics & Gynecology

## 2020-06-11 ENCOUNTER — Other Ambulatory Visit: Payer: Self-pay

## 2020-06-11 DIAGNOSIS — Z1231 Encounter for screening mammogram for malignant neoplasm of breast: Secondary | ICD-10-CM | POA: Diagnosis not present

## 2020-06-11 DIAGNOSIS — Z1239 Encounter for other screening for malignant neoplasm of breast: Secondary | ICD-10-CM

## 2021-05-20 ENCOUNTER — Other Ambulatory Visit (HOSPITAL_BASED_OUTPATIENT_CLINIC_OR_DEPARTMENT_OTHER): Payer: Self-pay | Admitting: Obstetrics & Gynecology

## 2021-05-20 DIAGNOSIS — Z1231 Encounter for screening mammogram for malignant neoplasm of breast: Secondary | ICD-10-CM

## 2021-05-30 ENCOUNTER — Inpatient Hospital Stay (HOSPITAL_BASED_OUTPATIENT_CLINIC_OR_DEPARTMENT_OTHER): Admission: RE | Admit: 2021-05-30 | Payer: Medicaid Other | Source: Ambulatory Visit

## 2021-07-08 ENCOUNTER — Other Ambulatory Visit (HOSPITAL_BASED_OUTPATIENT_CLINIC_OR_DEPARTMENT_OTHER): Payer: Self-pay | Admitting: Obstetrics & Gynecology

## 2021-07-09 ENCOUNTER — Encounter (HOSPITAL_BASED_OUTPATIENT_CLINIC_OR_DEPARTMENT_OTHER): Payer: Self-pay

## 2021-07-09 ENCOUNTER — Ambulatory Visit (HOSPITAL_BASED_OUTPATIENT_CLINIC_OR_DEPARTMENT_OTHER)
Admission: RE | Admit: 2021-07-09 | Discharge: 2021-07-09 | Disposition: A | Discharge status: Home / Self Care | Payer: Medicaid Other | Source: Ambulatory Visit | Attending: Obstetrics & Gynecology | Admitting: Obstetrics & Gynecology

## 2021-07-09 ENCOUNTER — Other Ambulatory Visit: Payer: Self-pay

## 2021-07-09 DIAGNOSIS — Z1231 Encounter for screening mammogram for malignant neoplasm of breast: Secondary | ICD-10-CM | POA: Diagnosis present

## 2021-08-11 ENCOUNTER — Other Ambulatory Visit: Payer: Self-pay

## 2021-08-11 DIAGNOSIS — Z3009 Encounter for other general counseling and advice on contraception: Secondary | ICD-10-CM

## 2021-08-11 MED ORDER — LEVONORGEST-ETH ESTRAD 91-DAY 0.15-0.03 MG PO TABS: 1.0000 | ORAL_TABLET | Freq: Every day | ORAL | 4 refills | Status: DC

## 2021-08-13 ENCOUNTER — Encounter: Payer: Self-pay | Admitting: Obstetrics & Gynecology

## 2021-08-13 ENCOUNTER — Ambulatory Visit (INDEPENDENT_AMBULATORY_CARE_PROVIDER_SITE_OTHER): Payer: Medicaid Other | Admitting: Obstetrics & Gynecology

## 2021-08-13 VITALS — BP 108/78 | HR 70 | Ht 67.0 in | Wt 206.0 lb

## 2021-08-13 DIAGNOSIS — Z01419 Encounter for gynecological examination (general) (routine) without abnormal findings: Secondary | ICD-10-CM

## 2021-08-13 DIAGNOSIS — N852 Hypertrophy of uterus: Secondary | ICD-10-CM | POA: Diagnosis not present

## 2021-08-13 DIAGNOSIS — Z3009 Encounter for other general counseling and advice on contraception: Secondary | ICD-10-CM

## 2021-08-13 MED ORDER — LEVONORGEST-ETH ESTRAD 91-DAY 0.15-0.03 MG PO TABS: 1.0000 | ORAL_TABLET | Freq: Every day | ORAL | 4 refills | Status: DC

## 2021-08-13 NOTE — Progress Notes (Signed)
Annual GYN ?Last Pap 05/13/20, WNL ?Mammo 07/2021, WNL ?Hx Bipolar, stable ?

## 2021-08-13 NOTE — Patient Instructions (Signed)
Uterine Fibroids ?Uterine fibroids, also called leiomyomas, are noncancerous (benign) tumors that can grow in the uterus. They can cause heavy menstrual bleeding and pain. Fibroids may also grow in the fallopian tubes, cervix, or tissues (ligaments) near the uterus. ?You may have one or many fibroids. Fibroids vary in size, weight, and where they grow in the uterus. Some can become quite large. Most fibroids do not require medical treatment. ?What are the causes? ?The cause of this condition is not known. ?What increases the risk? ?You are more likely to develop this condition if you: ?Are in your 30s or 40s and have not gone through menopause. ?Have a family history of this condition. ?Are of African American descent. ?Started your menstrual period at age 10 or younger. ?Have never given birth. ?Are overweight or obese. ?What are the signs or symptoms? ?Many women do not have any symptoms. Symptoms of this condition may include: ?Heavy menstrual bleeding. ?Bleeding between menstrual periods. ?Pain and pressure in the pelvic area, between your hip bones. ?Pain during sex. ?Bladder problems, such as needing to urinate right away or more often than usual. ?Inability to have children (infertility). ?Failure to carry pregnancy to term (miscarriage). ?How is this diagnosed? ?This condition may be diagnosed based on: ?Your symptoms and medical history. ?A physical exam. ?A pelvic exam that includes feeling for any tumors. ?Imaging tests, such as ultrasound or MRI. ?How is this treated? ?Treatment for this condition may include follow-up visits with your health care provider to monitor your fibroids for any changes. Other treatment may include: ?Medicines, such as: ?Medicines to relieve pain, including aspirin and NSAIDs, such as ibuprofen or naproxen. ?Hormone therapy. Treatment may be given as a pill or an injection, or it may be inserted into the uterus using an intrauterine device (IUD). ?Surgery that would do one of  the following: ?Remove the fibroids (myomectomy). This may be recommended if fibroids affect your fertility and you want to become pregnant. ?Remove the uterus (hysterectomy). ?Block the blood supply to the fibroids (uterine artery embolization). This can cause them to shrink and die. ?Follow these instructions at home: ?Medicines ?Take over-the-counter and prescription medicines only as told by your health care provider. ?Ask your health care provider if you should take iron pills or eat more iron-rich foods, such as dark green, leafy vegetables. Heavy menstrual bleeding can cause low iron levels. ?Managing pain ?If directed, apply heat to your back or abdomen to reduce pain. Use the heat source that your health care provider recommends, such as a moist heat pack or a heating pad. To apply heat: ?Place a towel between your skin and the heat source. ?Leave the heat on for 20-30 minutes. ?Remove the heat if your skin turns bright red. This is especially important if you are unable to feel pain, heat, or cold. You may have a greater risk of getting burned. ? ?General instructions ?Pay close attention to your menstrual cycle. Tell your health care provider about any changes, such as: ?Heavier bleeding that requires you to change your pads or tampons more than usual. ?A change in the number of days that your menstrual period lasts. ?A change in symptoms that come with your menstrual period, such as back pain or cramps in your abdomen. ?Keep all follow-up visits. This is important, especially if your fibroids need to be monitored for any changes. ?Contact a health care provider if you: ?Have pelvic pain, back pain, or cramps in your abdomen that do not get better with medicine   or heat. ?Develop new bleeding between menstrual periods. ?Have increased bleeding during or between menstrual periods. ?Feel more tired or weak than usual. ?Feel light-headed. ?Get help right away if you: ?Faint. ?Have pelvic pain that suddenly  gets worse. ?Have severe vaginal bleeding that soaks a tampon or pad in 30 minutes or less. ?Summary ?Uterine fibroids are noncancerous (benign) tumors that can develop in the uterus. ?The exact cause of this condition is not known. ?Most fibroids do not require medical treatment unless they affect your ability to have children (fertility). ?Contact a health care provider if you have pelvic pain, back pain, or cramps in your abdomen that do not get better with medicines. ?Get help right away if you faint, have pelvic pain that suddenly gets worse, or have severe vaginal bleeding. ?This information is not intended to replace advice given to you by your health care provider. Make sure you discuss any questions you have with your health care provider. ?Document Revised: 11/21/2019 Document Reviewed: 11/21/2019 ?Elsevier Patient Education ? 2022 Elsevier Inc. ? ?

## 2021-08-13 NOTE — Progress Notes (Signed)
Subjective:  ?  ? Tami Miller is a 46 y.o. female here for a routine exam.  Current complaints: none.  Doing well. No clinical issues. Her bleeding has improved since her last visit. Exercises somewhat. Played tennis yesterday and performed exercise in friends class: [employer]! ?  ?Gynecologic History ?Patient's last menstrual period was 08/05/2021. ?Contraception: OCP (estrogen/progesterone) ?Last Pap: 05/13/2020. Results were: normal ?Last mammogram: 07/09/2021. Results were: normal ? ?Obstetric History ?OB History  ?Gravida Para Term Preterm AB Living  ?3 3 2 1   3   ?SAB IAB Ectopic Multiple Live Births  ?        3  ?  ?# Outcome Date GA Lbr Len/2nd Weight Sex Delivery Anes PTL Lv  ?3 Term 2011 [redacted]w[redacted]d   M Vag-Spont EPI  LIV  ?2 Preterm 1999 [redacted]w[redacted]d   F Vag-Spont None  LIV  ?1 Term 1996 [redacted]w[redacted]d   F Vag-Spont None  LIV  ? ?The following portions of the patient's history were reviewed and updated as appropriate: allergies, current medications, past family history, past medical history, past social history, past surgical history, and problem list. ? ?Review of Systems ?Pertinent items are noted in HPI.  ?  ?Objective:  ?BP 108/78   Pulse 70   Ht 5\' 7"  (1.702 m)   Wt 206 lb (93.4 kg)   LMP 08/05/2021   BMI 32.26 kg/m?  ? ?General Appearance:    Alert, cooperative, no distress, appears stated age  ?Head:    Normocephalic, without obvious abnormality, atraumatic  ?Eyes:    conjunctiva/corneas clear, EOM's intact, both eyes  ?Ears:    Normal external ear canals, both ears  ?Nose:   Nares normal, septum midline, mucosa normal, no drainage    or sinus tenderness  ?Throat:   Lips, mucosa, and tongue normal; teeth and gums normal  ?Neck:   Supple, symmetrical, trachea midline, no adenopathy;  ?  thyroid:  no enlargement/tenderness/nodules  ?Back:     Symmetric, no curvature, ROM normal, no CVA tenderness  ?Lungs:     respirations unlabored  ?Chest Wall:    No tenderness or deformity  ? Heart:    Regular rate and rhythm   ?Breast Exam:    No tenderness, masses, or nipple abnormality  ?Abdomen:     Soft, non-tender, bowel sounds active all four quadrants,  ?  no masses, no organomegaly  ?Genitalia:    Normal female without lesion, discharge or tenderness  ? Uterus enlarged to 12 weeks sized, mobile with reg contour.   ?Extremities:   Extremities normal, atraumatic, no cyanosis or edema  ?Pulses:   2+ and symmetric all extremities  ?Skin:   Skin color, texture, turgor normal, no rashes or lesions  ?  ? ?Assessment:  ? ? Healthy female exam.  ?Enlarged uterus. Suspect uterine fibroids. Reviewed natural history and possible sx.   ?  ?Plan:  ? ?Tami Miller was seen today for gynecologic exam. ? ?Diagnoses and all orders for this visit: ? ?Well female exam with routine gynecological exam ? ?Enlarged uterus ? ?Encounter for counseling regarding contraception ?-     levonorgestrel-ethinyl estradiol (SEASONALE) 0.15-0.03 MG tablet; Take 1 tablet by mouth daily. ? ? F/u in 1 year or sooner prn  ?Due for PAP in 2025  ? ?Ted Goodner L. Harraway-Smith, M.D., FACOG ?  ? ?

## 2022-08-12 ENCOUNTER — Other Ambulatory Visit (HOSPITAL_BASED_OUTPATIENT_CLINIC_OR_DEPARTMENT_OTHER): Payer: Self-pay | Admitting: Obstetrics & Gynecology

## 2022-08-12 DIAGNOSIS — Z1231 Encounter for screening mammogram for malignant neoplasm of breast: Secondary | ICD-10-CM

## 2022-08-17 ENCOUNTER — Encounter (HOSPITAL_BASED_OUTPATIENT_CLINIC_OR_DEPARTMENT_OTHER): Payer: Self-pay

## 2022-08-17 ENCOUNTER — Ambulatory Visit (HOSPITAL_BASED_OUTPATIENT_CLINIC_OR_DEPARTMENT_OTHER)
Admission: RE | Admit: 2022-08-17 | Discharge: 2022-08-17 | Disposition: A | Discharge status: Home / Self Care | Payer: Medicaid Other | Source: Ambulatory Visit | Attending: Obstetrics & Gynecology | Admitting: Obstetrics & Gynecology

## 2022-08-17 DIAGNOSIS — Z1231 Encounter for screening mammogram for malignant neoplasm of breast: Secondary | ICD-10-CM | POA: Diagnosis present

## 2022-10-28 ENCOUNTER — Ambulatory Visit: Payer: Medicaid Other | Admitting: Obstetrics & Gynecology

## 2022-11-25 ENCOUNTER — Ambulatory Visit (INDEPENDENT_AMBULATORY_CARE_PROVIDER_SITE_OTHER): Payer: Medicaid Other | Admitting: Obstetrics & Gynecology

## 2022-11-25 ENCOUNTER — Other Ambulatory Visit (HOSPITAL_COMMUNITY)
Admission: RE | Admit: 2022-11-25 | Discharge: 2022-11-25 | Disposition: A | Discharge status: Home / Self Care | Payer: Medicaid Other | Source: Ambulatory Visit | Attending: Obstetrics & Gynecology | Admitting: Obstetrics & Gynecology

## 2022-11-25 ENCOUNTER — Encounter: Payer: Self-pay | Admitting: Obstetrics & Gynecology

## 2022-11-25 VITALS — BP 102/65 | HR 74 | Ht 67.0 in | Wt 225.0 lb

## 2022-11-25 DIAGNOSIS — Z01419 Encounter for gynecological examination (general) (routine) without abnormal findings: Secondary | ICD-10-CM

## 2022-11-25 DIAGNOSIS — Z1211 Encounter for screening for malignant neoplasm of colon: Secondary | ICD-10-CM

## 2022-11-25 DIAGNOSIS — N951 Menopausal and female climacteric states: Secondary | ICD-10-CM | POA: Diagnosis not present

## 2022-11-25 NOTE — Progress Notes (Signed)
Subjective:     Tami Miller is a 47 y.o. female here for a routine exam.  Current complaints: Pt reports hot flushes and sleep issues related to that. Took melatonin but, had crazy dreams. Not sure the dosage. On [place]. No menses.     Gynecologic History Patient's last menstrual period was 11/05/2022 (approximate). Contraception: OCP (estrogen/progesterone) Last Pap: 05/13/2020. Results were: normal Last mammogram: 08/17/2022. Results were: normal  Obstetric History OB History  Gravida Para Term Preterm AB Living  3 3 2 1   3   SAB IAB Ectopic Multiple Live Births          3    # Outcome Date GA Lbr Len/2nd Weight Sex Type Anes PTL Lv  3 Term 2011 [redacted]w[redacted]d   M Vag-Spont EPI  LIV  2 Preterm 1999 [redacted]w[redacted]d   F Vag-Spont None  LIV  1 Term 55 [redacted]w[redacted]d   F Vag-Spont None  LIV     The following portions of the patient's history were reviewed and updated as appropriate: allergies, current medications, past family history, past medical history, past social history, past surgical history, and problem list.  Review of Systems Pertinent items are noted in HPI.    Objective:  BP 102/65   Pulse 74   Ht 5\' 7"  (1.702 m)   Wt 225 lb (102.1 kg)   LMP 11/05/2022 (Approximate)   BMI 35.24 kg/m  General Appearance:    Alert, cooperative, no distress, appears stated age  Head:    Normocephalic, without obvious abnormality, atraumatic  Eyes:    conjunctiva/corneas clear, EOM's intact, both eyes  Ears:    Normal external ear canals, both ears  Nose:   Nares normal, septum midline, mucosa normal, no drainage    or sinus tenderness  Throat:   Lips, mucosa, and tongue normal; teeth and gums normal  Neck:   Supple, symmetrical, trachea midline, no adenopathy;    thyroid:  no enlargement/tenderness/nodules  Back:     Symmetric, no curvature, ROM normal, no CVA tenderness  Lungs:     respirations unlabored  Chest Wall:    No tenderness or deformity   Heart:    Regular rate and rhythm  Breast Exam:     No tenderness, masses, or nipple abnormality  Abdomen:     Soft, non-tender, bowel sounds active all four quadrants,    no masses, no organomegaly  Genitalia:    Normal female without lesion, discharge or tenderness   Uterus: enlarged 10-12 weeks sized. Mobile     Extremities:   Extremities normal, atraumatic, no cyanosis or edema  Pulses:   2+ and symmetric all extremities  Skin:   Skin color, texture, turgor normal, no rashes or lesions     Assessment:    Healthy female exam.    Plan:   Diagnoses and all orders for this visit:  Well female exam with routine gynecological exam -     Cytology - PAP( [facility])  Screening for colon cancer -     Ambulatory referral to Gastroenterology  Hot flushes, perimenopausal   F/u in 1 year or sooner prn   Keyla Milone L. Harraway-Smith, M.D., [place]

## 2022-11-25 NOTE — Patient Instructions (Signed)
Melatonin Chewable Tablets or Soft Chews What is this medication? MELATONIN (mel uh TOH nin) is promoted for sleep disorders, such as insomnia or jet lag. Melatonin helps regulate your sleep cycle. This supplement is not intended to diagnose, treat, cure, or prevent any disease. This medicine may be used for other purposes; ask your health care provider or pharmacist if you have questions. COMMON BRAND NAME(S): YumVs, [employer] [facility] What should I tell my care team before I take this medication? They need to know if you have any of these conditions: Cancer Depression or mental health condition Diabetes Frequently drink alcohol Hormone problems Immune system problems Liver disease Lung or breathing disease, such as asthma Organ transplant Seizure disorder An unusual or allergic reaction to melatonin, other medications, foods, dyes, or preservatives Pregnant or trying to get pregnant Breastfeeding How should I use this medication? Take this medication by mouth. Take it as directed on the label at the same time every day. Chew or crush it completely before swallowing. Do not swallow tablets whole. Take it on an empty stomach, 1 or 2 hours before bedtime. After taking this medication, limit your activities to those needed to prepare for bed. Do not use it more often than directed. Talk to your care team the use of this medication in children. Special care may be needed. Overdosage: If you think you have taken too much of this medicine contact a poison control center or emergency room at once. NOTE: This medicine is only for you. Do not share this medicine with others. What if I miss a dose? If you miss taking your dose at the usual time, skip that dose. If it is almost time for your next dose, take only that dose. Do not take double or extra doses. What may interact with this medication? Do not take this medication with any of the  following: Fluvoxamine Ramelteon Tasimelteon This medication may also interact with the following: Alcohol Caffeine Carbamazepine Certain antibiotics, such as ciprofloxacin Certain medications for depression, anxiety, or mental health conditions Cimetidine Estrogen or progestin hormones Methoxsalen Nifedipine Other herbal or dietary supplements Other medications that help you fall asleep Phenobarbital Rifampin Smoking tobacco Tamoxifen Warfarin This list may not describe all possible interactions. Give your health care provider a list of all the medicines, herbs, non-prescription drugs, or dietary supplements you use. Also tell them if you smoke, drink alcohol, or use illegal drugs. Some items may interact with your medicine. What should I watch for while using this medication? See your care team if your symptoms do not get better or if they get worse. Do not take this medication for more than 2 weeks unless your care team tells you to. This medication may affect your coordination, reaction time, or judgment. Do not drive or operate machinery until you know how this medication affects you. Sit up or stand slowly to reduce the risk of dizzy or fainting spells. Drinking alcohol with this medication can increase the risk of these side effects. You may do unusual sleep behaviors or activities you do not remember the day after taking this medication. Activities include driving, making or eating food, talking on the phone, sexual activity, or sleep walking. Stop taking this medication and call your care team right away if you find out you have done activities like this. Talk to your care team before you use this medication if you are currently being treated for an emotional, mental, or sleep problem. This medication may interfere with your treatment. Herbal or  dietary supplements are not regulated like medications. Rigid quality control standards are not required for dietary supplements. The  purity and strength of these products can vary. The safety and effect of this dietary supplement for a certain disease or illness is not well known. This product is not intended to diagnose, treat, cure or prevent any disease. The Food and Drug Administration suggests the following to help consumers protect themselves: Always read product labels and follow directions. Natural does not mean a product is safe for humans to take. Look for products that include USP after the ingredient name. This means that the manufacturer followed the standards of the [place] Pharmacopoeia. Supplements made or sold by a nationally known food or drug company are more likely to be made under tight controls. You can write to the company for more information about how the product was made. What side effects may I notice from receiving this medication? Side effects that you should report to your care team as soon as possible: Allergic reactions--skin rash, itching, hives, swelling of the face, lips, tongue, or throat Mood and behavior changes--anxiety, nervousness, confusion, hallucinations, irritability, hostility, thoughts of suicide or self-harm, worsening mood, feelings of depression Side effects that usually do not require medical attention (report to your care team if they continue or are bothersome): Bedwetting in children Dizziness Drowsiness the day after use Headache Nausea This list may not describe all possible side effects. Call your doctor for medical advice about side effects. You may report side effects to FDA at 1-800-FDA-1088. Where should I keep my medication? Keep out of the reach of children and pets. Store at room temperature or as directed on the package label. Protect from moisture. Throw away any unused medication after the expiration date. NOTE: This sheet is a summary. It may not cover all possible information. If you have questions about this medicine, talk to your doctor, pharmacist, or health  care provider.  2024 Elsevier/Gold Standard (2021-10-28 00:00:00) Perimenopause Perimenopause is the normal time of a woman's life when the levels of estrogen, the female hormone produced by the ovaries, begin to decrease. This leads to changes in menstrual periods before they stop completely (menopause). Perimenopause can begin 2-8 years before menopause. During perimenopause, the ovaries may or may not produce an egg and a woman can still become pregnant. What are the causes? This condition is caused by a natural change in hormone levels that happens as you get older. What increases the risk? This condition is more likely to start at an earlier age if you have certain medical conditions or have undergone treatments, including: A tumor of the pituitary gland in the brain. A disease that affects the ovaries and hormone production. Certain cancer treatments, such as chemotherapy or hormone therapy, or radiation therapy on the pelvis. Heavy smoking and excessive alcohol use. Family history of early menopause. What are the signs or symptoms? Perimenopausal changes affect each woman differently. Symptoms of this condition may include: Hot flashes. Irregular menstrual periods. Night sweats. Changes in feelings about sex. This could be a decrease in sex drive or an increased discomfort around your sexuality. Vaginal dryness. Headaches. Mood swings. Depression. Problems sleeping (insomnia). Memory problems or trouble concentrating. Irritability. Tiredness. Weight gain. Anxiety. Trouble getting pregnant. How is this diagnosed? This condition is diagnosed based on your medical history, a physical exam, your age, your menstrual history, and your symptoms. Hormone tests may also be done. How is this treated? In some cases, no treatment is needed. You and your  health care provider should make a decision together about whether treatment is necessary. Treatment will be based on your individual  condition and preferences. Various treatments are available, such as: Menopausal hormone therapy (MHT). Medicines to treat specific symptoms. Acupuncture. Vitamin or herbal supplements. Before starting treatment, make sure to let your health care provider know if you have a personal or family history of: Heart disease. Breast cancer. Blood clots. Diabetes. Osteoporosis. Follow these instructions at home: Medicines Take over-the-counter and prescription medicines only as told by your health care provider. Take vitamin supplements only as told by your health care provider. Talk with your health care provider before starting any herbal supplements. Lifestyle  Do not use any products that contain nicotine or tobacco, such as cigarettes, e-cigarettes, and chewing tobacco. If you need help quitting, ask your health care provider. Get at least 30 minutes of physical activity on 5 or more days each week. Eat a balanced diet that includes fresh fruits and vegetables, whole grains, soybeans, eggs, lean meat, and low-fat dairy. Avoid alcoholic and caffeinated beverages, as well as spicy foods. This may help prevent hot flashes. Get 7-8 hours of sleep each night. Dress in layers that can be removed to help you manage hot flashes. Find ways to manage stress, such as deep breathing, meditation, or journaling. General instructions  Keep track of your menstrual periods, including: When they occur. How heavy they are and how long they last. How much time passes between periods. Keep track of your symptoms, noting when they start, how often you have them, and how long they last. Use vaginal lubricants or moisturizers to help with vaginal dryness and improve comfort during sex. You can still become pregnant if you are having irregular periods. Make sure you use contraception during perimenopause if you do not want to get pregnant. Keep all follow-up visits. This is important. This includes any group  therapy or counseling. Contact a health care provider if: You have heavy vaginal bleeding or pass blood clots. Your period lasts more than 2 days longer than normal. Your periods are recurring sooner than 21 days. You bleed after having sex. You have pain during sex. Get help right away if you have: Chest pain, trouble breathing, or trouble talking. Severe depression. Pain when you urinate. Severe headaches. Vision problems. Summary Perimenopause is the time when a woman's body begins to move into menopause. This may happen naturally or as a result of other health problems or medical treatments. Perimenopause can begin 2-8 years before menopause, and it can last for several years. Perimenopausal symptoms can be managed through medicines, lifestyle changes, and complementary therapies such as acupuncture. This information is not intended to replace advice given to you by your health care provider. Make sure you discuss any questions you have with your health care provider. Document Revised: 10/05/2019 Document Reviewed: 10/05/2019 Elsevier Patient Education  2024 [employer].

## 2023-02-10 ENCOUNTER — Ambulatory Visit: Payer: Medicaid Other | Admitting: Obstetrics & Gynecology

## 2023-02-24 ENCOUNTER — Ambulatory Visit: Payer: Medicaid Other | Admitting: Obstetrics & Gynecology

## 2023-03-12 ENCOUNTER — Encounter: Payer: Self-pay | Admitting: Gastroenterology

## 2023-04-30 ENCOUNTER — Ambulatory Visit (AMBULATORY_SURGERY_CENTER): Payer: Medicaid Other | Admitting: *Deleted

## 2023-04-30 VITALS — Ht 67.0 in | Wt 212.0 lb

## 2023-04-30 DIAGNOSIS — Z1211 Encounter for screening for malignant neoplasm of colon: Secondary | ICD-10-CM

## 2023-04-30 MED ORDER — PEG 3350-KCL-NA BICARB-NACL 420 G PO SOLR: 4000.0000 mL | Freq: Once | ORAL | 0 refills | Status: AC

## 2023-04-30 NOTE — Progress Notes (Signed)
Pt's name and DOB verified at the beginning of the pre-visit wit 2 identifiers  Pt denies any difficulty with ambulating,sitting, laying down or rolling side to side  Pt has no issues with ambulation   Pt has no issues moving head neck or swallowing  No egg or soy allergy known to patient   Patient denies ever being intubated  No FH of Malignant Hyperthermia  Pt is not on diet pills or shots  Pt is not on home 02   Pt is not on blood thinners   Pt has frequent issues with constipation RN instructed pt to use Miralax per bottles instructions a week before prep days. Pt states they will  Pt is not on dialysis  Pt denise any abnormal heart rhythms   Pt denies any upcoming cardiac testing  Pt encouraged to use to use Singlecare or Goodrx to reduce cost   Patient's chart reviewed by Cathlyn Parsons CNRA prior to pre-visit and patient appropriate for the [facility].  Pre-visit completed and red dot placed by patient's name on their procedure day (on provider's schedule).  .  Visit by phone  Pt states weight is 212 lb  Instructed pt why it is important to and  to call if they have any changes in health or new medications. Directed them to the # given and on instructions.     Instructions reviewed. Pt given both [facility] main # and MD on call # prior to instructions.  Pt states understanding. Instructed to review again prior to procedure. Pt states they will.   Instructions sent by mail with coupon and by My Chart  Coupon sent via text to mobile phone and pt verified they received it

## 2023-05-14 ENCOUNTER — Ambulatory Visit (AMBULATORY_SURGERY_CENTER): Payer: Medicaid Other | Admitting: Gastroenterology

## 2023-05-14 ENCOUNTER — Encounter: Payer: Self-pay | Admitting: Gastroenterology

## 2023-05-14 VITALS — BP 128/72 | HR 68 | Temp 97.4°F | Resp 16 | Ht 67.0 in | Wt 212.0 lb

## 2023-05-14 DIAGNOSIS — Z1211 Encounter for screening for malignant neoplasm of colon: Secondary | ICD-10-CM

## 2023-05-14 DIAGNOSIS — K5730 Diverticulosis of large intestine without perforation or abscess without bleeding: Secondary | ICD-10-CM | POA: Diagnosis not present

## 2023-05-14 DIAGNOSIS — D123 Benign neoplasm of transverse colon: Secondary | ICD-10-CM | POA: Diagnosis not present

## 2023-05-14 HISTORY — PX: COLONOSCOPY: SHX174

## 2023-05-14 MED ORDER — SODIUM CHLORIDE 0.9 % IV SOLN: 500.0000 mL | INTRAVENOUS | Status: AC

## 2023-05-14 NOTE — Progress Notes (Signed)
To Pacu, VSS. Report to [email or web address] 

## 2023-05-14 NOTE — Op Note (Signed)
 [facility] Patient Name: Tami Miller Procedure Date: 05/14/2023 10:45 AM MRN: [id number] Endoscopist: Glendia E. Stacia , MD, [id number] Age: 48 Referring MD:  Date of Birth: [birth year] Gender: Female Account #: [id number] Procedure:                Colonoscopy Indications:              Screening for colorectal malignant neoplasm, This                            is the patient's first colonoscopy Medicines:                Monitored Anesthesia Care Procedure:                Pre-Anesthesia Assessment:                           - Prior to the procedure, a History and Physical                            was performed, and patient medications and                            allergies were reviewed. The patient's tolerance of                            previous anesthesia was also reviewed. The risks                            and benefits of the procedure and the sedation                            options and risks were discussed with the patient.                            All questions were answered, and informed consent                            was obtained. Prior Anticoagulants: The patient has                            taken no anticoagulant or antiplatelet agents. ASA                            Grade Assessment: II - A patient with mild systemic                            disease. After reviewing the risks and benefits,                            the patient was deemed in satisfactory condition to                            undergo the procedure.  After obtaining informed consent, the colonoscope                            was passed under direct vision. Throughout the                            procedure, the patient's blood pressure, pulse, and                            oxygen saturations were monitored continuously. The                            CF [id number] #[id number] was introduced through the anus                            and advanced  to the the terminal ileum, with                            identification of the appendiceal orifice and IC                            valve. The colonoscopy was performed without                            difficulty. The patient tolerated the procedure                            well. The quality of the bowel preparation was                            adequate. The terminal ileum, ileocecal valve,                            appendiceal orifice, and rectum were photographed.                            The bowel preparation used was GoLYTELY via split                            dose instruction. Scope In: 10:51:15 AM Scope Out: 11:03:59 AM Scope Withdrawal Time: 0 hours 8 minutes 58 seconds  Total Procedure Duration: 0 hours 12 minutes 44 seconds  Findings:                 The perianal and digital rectal examinations were                            normal. Pertinent negatives include normal                            sphincter tone and no palpable rectal lesions.                           An 8 mm polyp was found in the distal transverse  colon. The polyp was semi-pedunculated. The polyp                            was removed with a cold snare. Resection and                            retrieval were complete. Estimated blood loss was                            minimal.                           A few medium-mouthed and small-mouthed diverticula                            were found in the ascending colon.                           The exam was otherwise normal throughout the                            examined colon.                           The terminal ileum appeared normal.                           The retroflexed view of the distal rectum and anal                            verge was normal and showed no anal or rectal                            abnormalities. Complications:            No immediate complications. Estimated Blood Loss:     Estimated blood  loss was minimal. Impression:               - One 8 mm polyp in the distal transverse colon,                            removed with a cold snare. Resected and retrieved.                           - Mild diverticulosis in the ascending colon.                           - The examined portion of the ileum was normal.                           - The distal rectum and anal verge are normal on                            retroflexion view. Recommendation:           - Patient has a contact number available  for                            emergencies. The signs and symptoms of potential                            delayed complications were discussed with the                            patient. Return to normal activities tomorrow.                            Written discharge instructions were provided to the                            patient.                           - Resume previous diet.                           - Continue present medications.                           - Await pathology results.                           - Repeat colonoscopy (date not yet determined) for                            surveillance based on pathology results. Namiko Pritts E. Stacia, MD 05/14/2023 11:08:17 AM This report has been signed electronically.

## 2023-05-14 NOTE — Progress Notes (Signed)
 [facility] History and Physical   Primary Care Physician:  System, Provider Not In   Reason for Procedure:   Colon cancer screening  Plan:    Screening colonoscopy     HPI: Tami Miller is a 48 y.o. female undergoing initial average risk screening colonoscopy.  She has no family history of colon cancer and no chronic GI symptoms.    Past Medical History:  Diagnosis Date   Anxiety    Bipolar affective disorder (HCC)    Bipolar disorder, curr episode depressed, severe, w/psychotic features (HCC)    Depression     Past Surgical History:  Procedure Laterality Date   HYSTEROSCOPY WITH D & C N/A 04/07/2018   Procedure: DILATATION AND CURETTAGE /HYSTEROSCOPY  IUD Removal;  Surgeon: Corene Coy, MD;  Location: [facility];  Service: Gynecology;  Laterality: N/A;   wisdon teeth      Prior to Admission medications   Medication Sig Start Date End Date Taking? Authorizing Provider  levonorgestrel-ethinyl estradiol (SEASONALE) 0.15-0.03 MG tablet Take 1 tablet by mouth daily. 08/13/21  Yes Corene Coy, MD    Current Outpatient Medications  Medication Sig Dispense Refill   levonorgestrel-ethinyl estradiol (SEASONALE) 0.15-0.03 MG tablet Take 1 tablet by mouth daily. 91 tablet 4   Current Facility-Administered Medications  Medication Dose Route Frequency Provider Last Rate Last Admin   0.9 %  sodium chloride  infusion  500 mL Intravenous Continuous Stacia Glendia BRAVO, MD        Allergies as of 05/14/2023   (No Known Allergies)    Family History  Problem Relation Age of Onset   Bipolar disorder Mother    Breast cancer Maternal Aunt    Breast cancer Maternal Grandmother    Colon cancer Neg Hx    Colon polyps Neg Hx    Esophageal cancer Neg Hx    Rectal cancer Neg Hx    Stomach cancer Neg Hx     Social History   Socioeconomic History   Marital status: Single    Spouse name: Not on file   Number of children: Not on file   Years of  education: Not on file   Highest education level: Not on file  Occupational History   Not on file  Tobacco Use   Smoking status: Former    Current packs/day: 0.00    Average packs/day: 0.5 packs/day for 0.5 years (0.3 ttl pk-yrs)    Types: Cigarettes    Start date: 11/2014    Quit date: 2017    Years since quitting: 8.0   Smokeless tobacco: Never  Vaping Use   Vaping status: Never Used  Substance and Sexual Activity   Alcohol use: Yes    Comment: occ   Drug use: Not Currently    Types: Marijuana   Sexual activity: Yes    Birth control/protection: Abstinence, Pill  Other Topics Concern   Not on file  Social History Narrative   Not on file   Social Drivers of Health   Financial Resource Strain: Not on file  Food Insecurity: Not on file  Transportation Needs: Not on file  Physical Activity: Not on file  Stress: Not on file  Social Connections: Not on file  Intimate Partner Violence: Not on file    Review of Systems:  All other review of systems negative except as mentioned in the HPI.  Physical Exam: Vital signs BP 115/78   Pulse 75   Temp (!) 97.4 F (36.3 C)   Ht 5' 7 (1.702  m)   Wt 212 lb (96.2 kg)   SpO2 100%   BMI 33.20 kg/m   General:   Alert,  Well-developed, well-nourished, pleasant and cooperative in NAD Airway:  Mallampati 2 Lungs:  Clear throughout to auscultation.   Heart:  Regular rate and rhythm; no murmurs, clicks, rubs,  or gallops. Abdomen:  Soft, nontender and nondistended. Normal bowel sounds.   Neuro/Psych:  Normal mood and affect. A and O x 3   Harlo Fabela E. Stacia, MD [facility]

## 2023-05-14 NOTE — Progress Notes (Signed)
 Pt's states no medical or surgical changes since previsit or office visit.

## 2023-05-14 NOTE — Patient Instructions (Addendum)
 Resume previous diet.                           - Continue present medications.                           - Await pathology results.                           - Repeat colonoscopy (date not yet determined) for                            surveillance based on pathology results. Handout on polyps and diverticulosis given.     YOU HAD AN ENDOSCOPIC PROCEDURE TODAY AT THE  ENDOSCOPY CENTER:   Refer to the procedure report that was given to you for any specific questions about what was found during the examination.  If the procedure report does not answer your questions, please call your gastroenterologist to clarify.  If you requested that your care partner not be given the details of your procedure findings, then the procedure report has been included in a sealed envelope for you to review at your convenience later.  YOU SHOULD EXPECT: Some feelings of bloating in the abdomen. Passage of more gas than usual.  Walking can help get rid of the air that was put into your GI tract during the procedure and reduce the bloating. If you had a lower endoscopy (such as a colonoscopy or flexible sigmoidoscopy) you may notice spotting of blood in your stool or on the toilet paper. If you underwent a bowel prep for your procedure, you may not have a normal bowel movement for a few days.  Please Note:  You might notice some irritation and congestion in your nose or some drainage.  This is from the oxygen used during your procedure.  There is no need for concern and it should clear up in a day or so.  SYMPTOMS TO REPORT IMMEDIATELY:  Following lower endoscopy (colonoscopy or flexible sigmoidoscopy):  Excessive amounts of blood in the stool  Significant tenderness or worsening of abdominal pains  Swelling of the abdomen that is new, acute  Fever of 100F or higher   For urgent or emergent issues, a gastroenterologist can be reached at any hour by calling [phone]. Do not use MyChart messaging for  urgent concerns.    DIET:  We do recommend a small meal at first, but then you may proceed to your regular diet.  Drink plenty of fluids but you should avoid alcoholic beverages for 24 hours.  ACTIVITY:  You should plan to take it easy for the rest of today and you should NOT DRIVE or use heavy machinery until tomorrow (because of the sedation medicines used during the test).    FOLLOW UP: Our staff will call the number listed on your records the next business day following your procedure.  We will call around 7:15- 8:00 am to check on you and address any questions or concerns that you may have regarding the information given to you following your procedure. If we do not reach you, we will leave a message.     If any biopsies were taken you will be contacted by phone or by letter within the next 1-3 weeks.  Please call us  at [phone] if you have not heard about  the biopsies in 3 weeks.    SIGNATURES/CONFIDENTIALITY: You and/or your care partner have signed paperwork which will be entered into your electronic medical record.  These signatures attest to the fact that that the information above on your After Visit Summary has been reviewed and is understood.  Full responsibility of the confidentiality of this discharge information lies with you and/or your care-partner.

## 2023-05-14 NOTE — Progress Notes (Signed)
 Called to room to assist during endoscopic procedure.  Patient ID and intended procedure confirmed with present staff. Received instructions for my participation in the procedure from the performing physician.

## 2023-05-17 ENCOUNTER — Telehealth: Payer: Self-pay

## 2023-05-17 NOTE — Telephone Encounter (Signed)
 Follow up call to pt, lm for pt to call if having any difficulty with normal activities or eating and drinking.  Also to call if any other questions or concerns.

## 2023-05-18 LAB — SURGICAL PATHOLOGY

## 2023-05-19 NOTE — Progress Notes (Signed)
 Ms. Tami Miller,  The polyp which I removed during your recent procedure was proven to be completely benign but is considered a pre-cancerous polyp that MAY have grown into cancer if it had not been removed.  Studies shows that at least 20% of women over age 46 and 30% of men over age 66 have pre-cancerous polyps.  Based on current nationally recognized surveillance guidelines, I recommend that you have a repeat colonoscopy in 7 years.   If you develop any new rectal bleeding, abdominal pain or significant bowel habit changes, please contact me before then.

## 2023-09-03 ENCOUNTER — Other Ambulatory Visit (HOSPITAL_BASED_OUTPATIENT_CLINIC_OR_DEPARTMENT_OTHER): Payer: Self-pay | Admitting: Obstetrics & Gynecology

## 2023-09-03 DIAGNOSIS — Z1231 Encounter for screening mammogram for malignant neoplasm of breast: Secondary | ICD-10-CM

## 2023-09-07 ENCOUNTER — Encounter (HOSPITAL_BASED_OUTPATIENT_CLINIC_OR_DEPARTMENT_OTHER): Payer: Self-pay

## 2023-09-07 ENCOUNTER — Ambulatory Visit (HOSPITAL_BASED_OUTPATIENT_CLINIC_OR_DEPARTMENT_OTHER)
Admission: RE | Admit: 2023-09-07 | Discharge: 2023-09-07 | Disposition: A | Discharge status: Home / Self Care | Source: Ambulatory Visit | Attending: Obstetrics & Gynecology | Admitting: Obstetrics & Gynecology

## 2023-09-07 DIAGNOSIS — Z1231 Encounter for screening mammogram for malignant neoplasm of breast: Secondary | ICD-10-CM | POA: Diagnosis present

## 2023-10-21 IMAGING — MG MM DIGITAL SCREENING BILAT W/ TOMO AND CAD
8 series · 8 of 24 positions shown · non-contrast
Comparison: Previous exam(s).

CLINICAL DATA: Screening.

EXAM:
DIGITAL SCREENING BILATERAL MAMMOGRAM WITH TOMOSYNTHESIS AND CAD
TECHNIQUE: Bilateral screening digital craniocaudal and mediolateral oblique
mammograms were obtained. Bilateral screening digital breast
tomosynthesis was performed. The images were evaluated with
computer-aided detection.

[L MLO synth-2D]
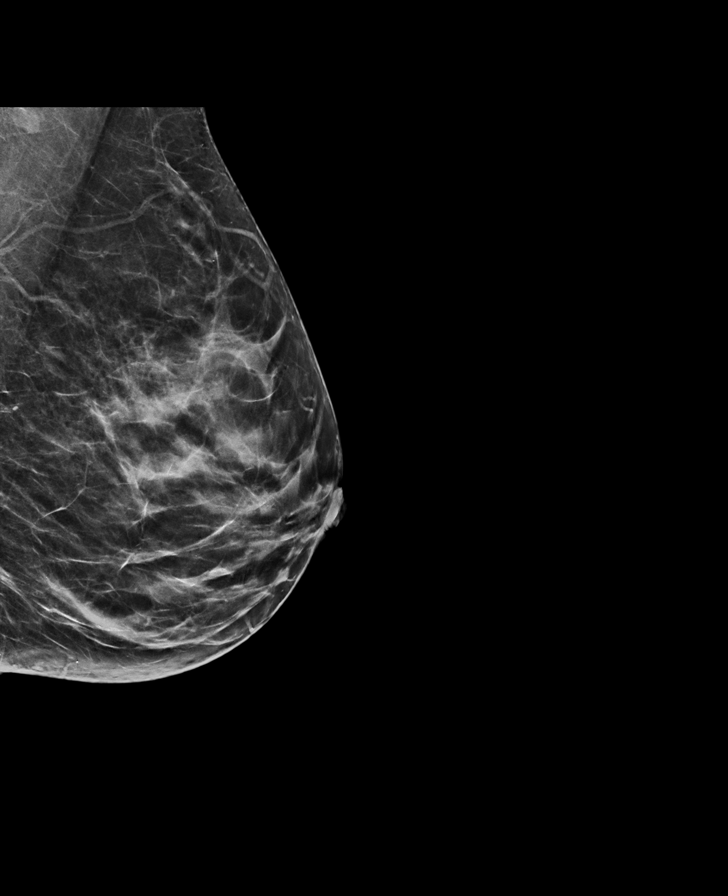

[L CC synth-2D]
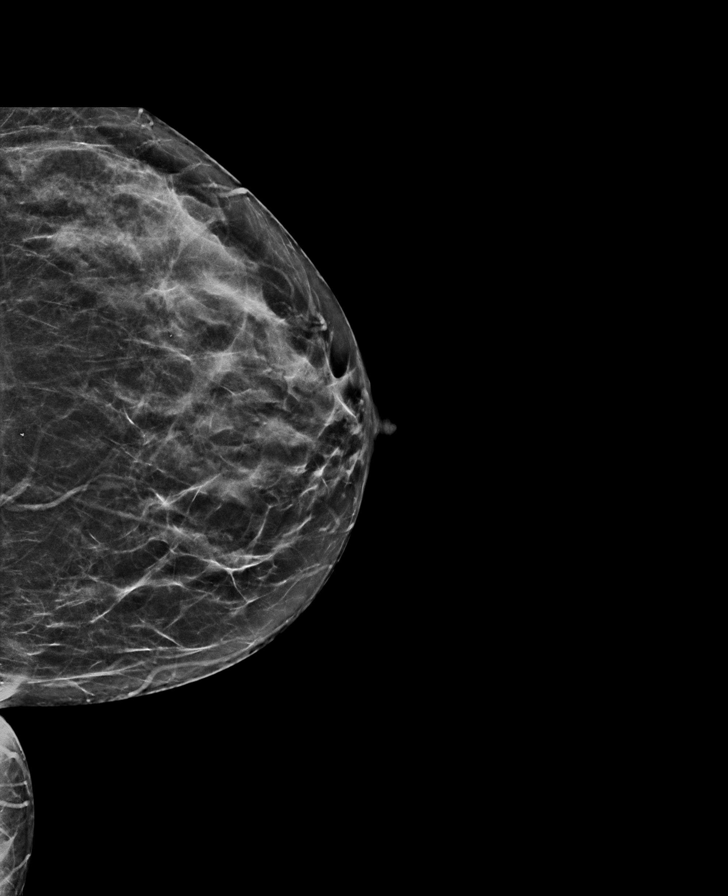

[R MLO synth-2D]
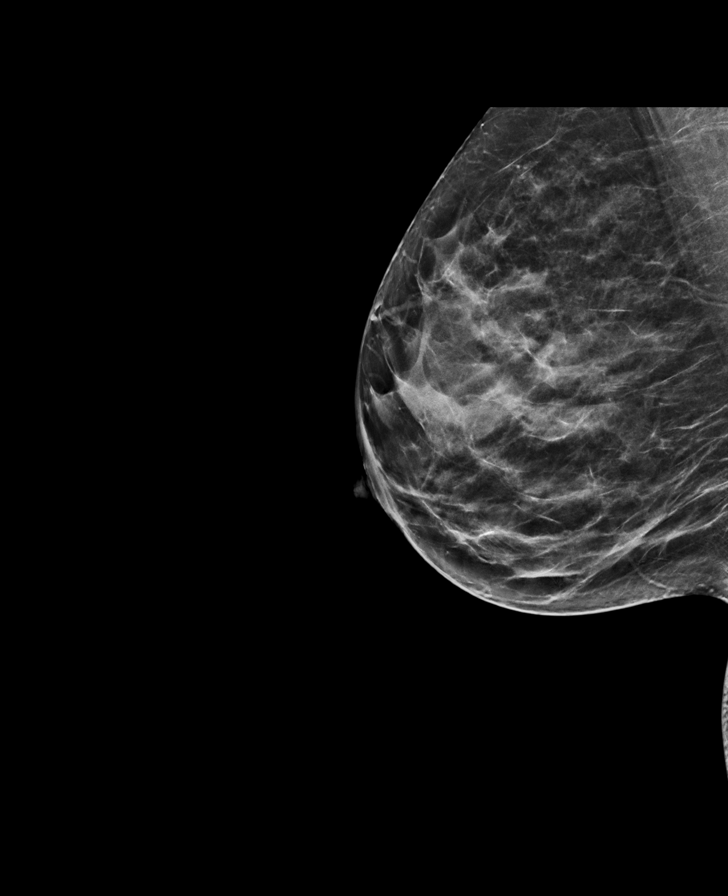

[R CC synth-2D]
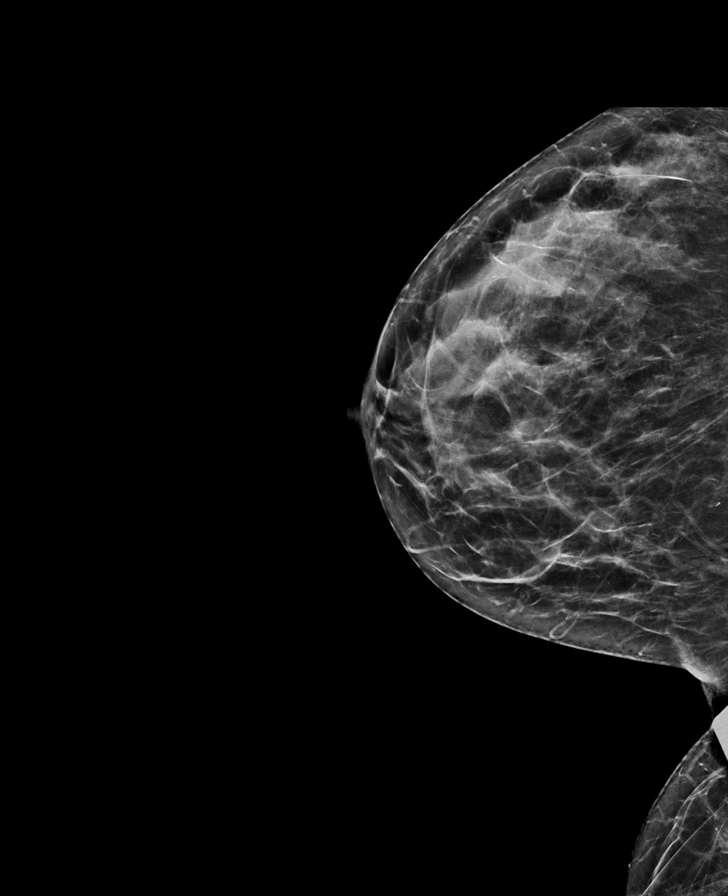

[R MLO tomo · tomo slice 31/60.0]
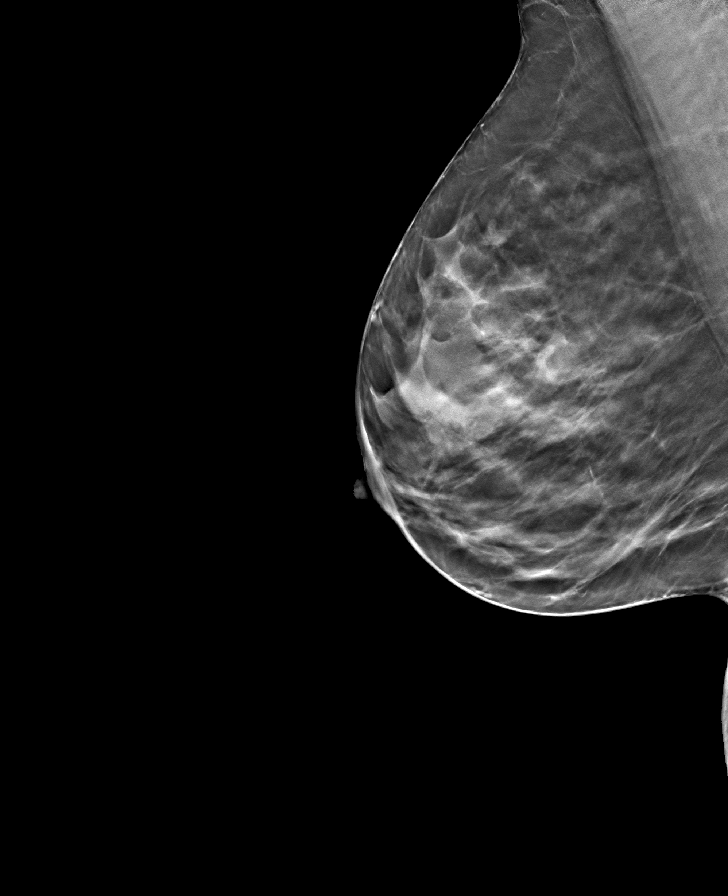

[L CC tomo · tomo slice 29/58.0]
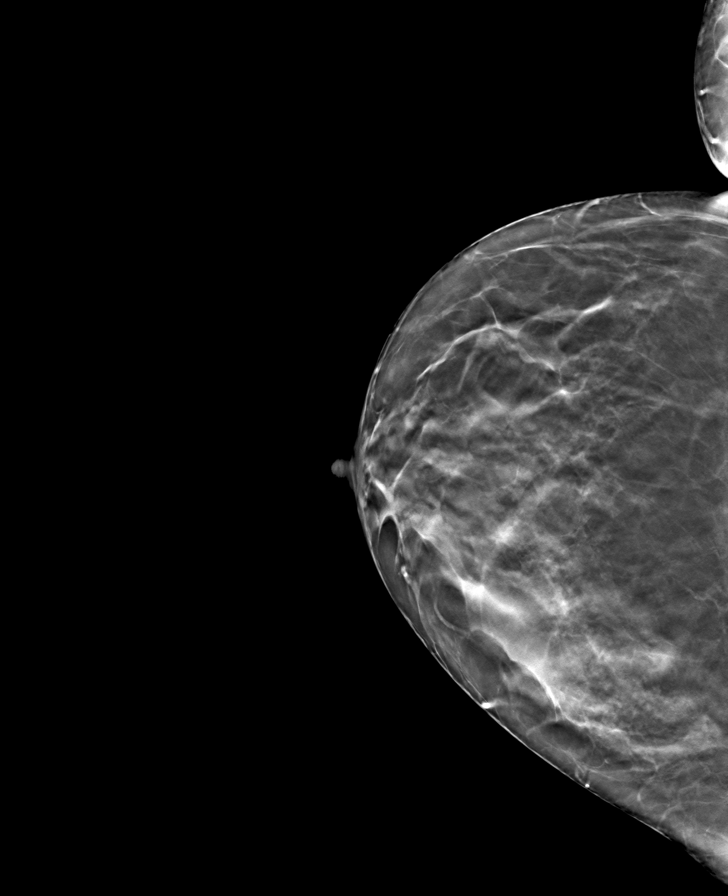

[L MLO tomo · tomo slice 32/63.0]
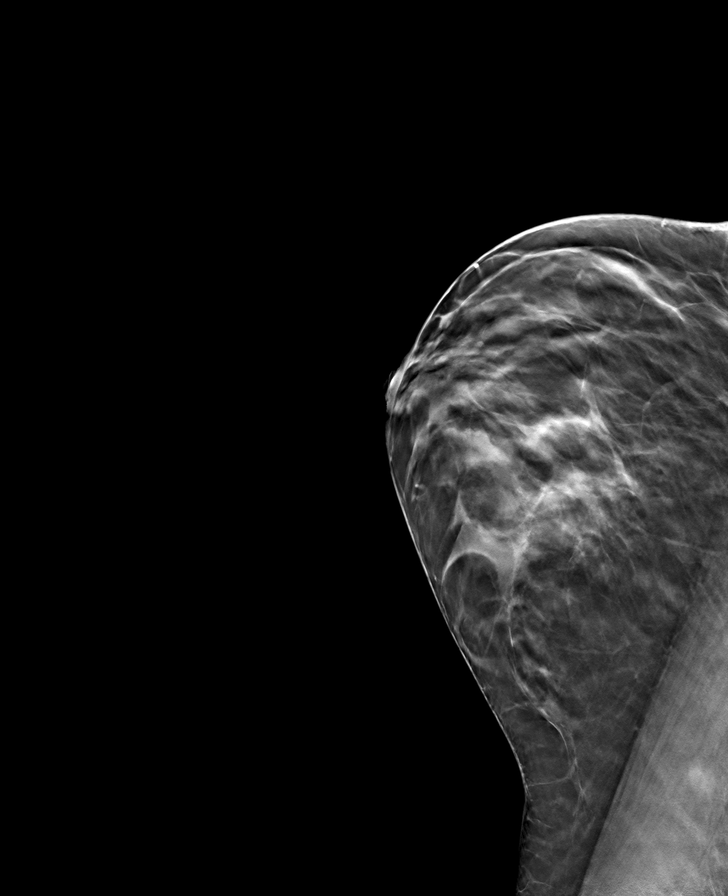

[R CC tomo · tomo slice 32/63.0]
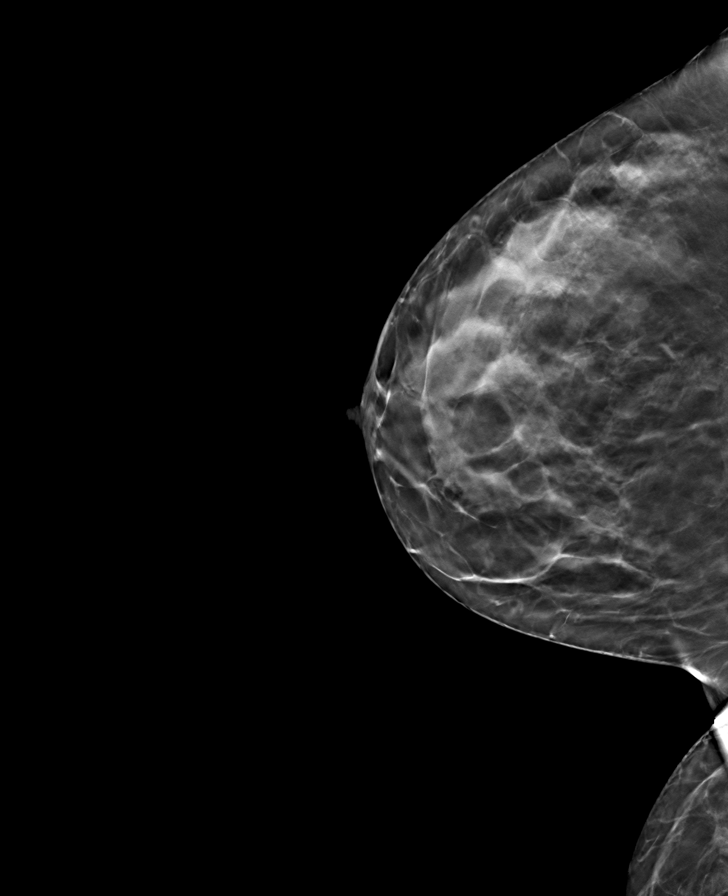

[8 of 24 positions shown; findings below may reference images not displayed]

ACR Breast Density Category c: The breast tissue is heterogeneously
dense, which may obscure small masses.
FINDINGS: There are no findings suspicious for malignancy.
IMPRESSION: No mammographic evidence of malignancy. A result letter of this
screening mammogram will be mailed directly to the patient.

RECOMMENDATION:
Screening mammogram in one year. (Code:Q3-W-BC3)

BI-RADS CATEGORY  1: Negative.

## 2024-01-07 ENCOUNTER — Ambulatory Visit: Admitting: Obstetrics and Gynecology

## 2024-02-04 ENCOUNTER — Ambulatory Visit

## 2024-02-04 VITALS — BP 115/77 | HR 71 | Ht 67.0 in | Wt 211.0 lb

## 2024-02-04 DIAGNOSIS — Z01419 Encounter for gynecological examination (general) (routine) without abnormal findings: Secondary | ICD-10-CM

## 2024-02-04 DIAGNOSIS — N951 Menopausal and female climacteric states: Secondary | ICD-10-CM | POA: Diagnosis not present

## 2024-02-04 DIAGNOSIS — D259 Leiomyoma of uterus, unspecified: Secondary | ICD-10-CM | POA: Diagnosis not present

## 2024-02-04 DIAGNOSIS — Z3009 Encounter for other general counseling and advice on contraception: Secondary | ICD-10-CM

## 2024-02-04 DIAGNOSIS — Z1239 Encounter for other screening for malignant neoplasm of breast: Secondary | ICD-10-CM

## 2024-02-04 DIAGNOSIS — Z3041 Encounter for surveillance of contraceptive pills: Secondary | ICD-10-CM

## 2024-02-04 NOTE — Progress Notes (Signed)
 GYNECOLOGY OFFICE VISIT NOTE-WELL WOMAN EXAM  History:    Tami Miller is a 48 year old here today for annual. She reports menses once q . She reports regular 5 days with moderate. She reports some cramping, that she takes medication, but notes h/o sciatica that is worse with menses.   Reports she has cut coffee and has bedtime routine (no eating at least 2 hrs before, no screens for at least one hour, menopausal tea). She reports initially it was working, but last 2 months symptoms have worsened.  She reports increase in feeling of arousal, mood changes, and worsening night sweats. She also reports night terrors and increased acne.    Birth Control:  [place]; No issues.   Reproductive Concerns Sexually Active: Reports Abstinence x 9 years Partners Type: Number of partners in last year: STD Testing:   Obstetrical History: G3P2103  Gynecological History: Reports h/o abnormal pap with pregnancy. None since. No surgical history.  Vaginal/GU Concerns: No concerns. No issues with urination, constipation, or diarrhea.  Breast Concerns/Exams: No concerns.  Reports breast exams 2-3x/wk. Endorses SBA. Reports maternal grandmother with breast CA. No other none family history of breast, uterine, cervical, or ovarian cancer. Reports mom had hysterectomy and sister had partial hysterectomy, but unsure of exact causes. Notes lots of members have fibroids or cysts.   Medical and Nutrition PCP: [facility]. Last appt 2-3 weeks.  Significant PMx: None Exercise: 3x/week. Walking or [employer]. 30-45 minutes Tobacco/Drugs/Alcohol/Vaping: Occasional Wine. Otherwise negative.  Nutrition: Endorses balanced intake.  Supplements: [place] (Depression), [employer], [place], Vitamin D3, Green Tea Extract, Vitamin C  Social Safety at home: Endorses. Lives with son. DV/A: N/A Social Support: Endorses Employment: Works; Engineer, structural for elderly  Past Medical History:   Diagnosis Date   Anxiety    Bipolar affective disorder (HCC)    Bipolar disorder, curr episode depressed, severe, w/psychotic features (HCC)    Depression     Past Surgical History:  Procedure Laterality Date   COLONOSCOPY  05/14/2023   Glendia [facility] at [facility]   HYSTEROSCOPY WITH D & C N/A 04/07/2018   Procedure: DILATATION AND CURETTAGE /HYSTEROSCOPY  IUD Removal;  Surgeon: Corene Elveria, MD;  Location: [facility];  Service: Gynecology;  Laterality: N/A;   wisdon teeth      The following portions of the patient's history were reviewed and updated as appropriate: allergies, current medications, past family history, past medical history, past social history, past surgical history and problem list.   Health Maintenance: Pap: July 2024 , Negative Results.  Mammogram: May 2025-Negtive.  Colonoscopy: Jan 2025; Polyp Removal with Benign Pathology     02/04/2024    8:22 AM 11/25/2022    2:11 PM 08/13/2021    1:58 PM  Depression screen PHQ 2/9  Decreased Interest 0 0 0  Down, Depressed, Hopeless 1 0 0  PHQ - 2 Score 1 0 0  Altered sleeping 1 2 0  Tired, decreased energy 0 2 0  Change in appetite 0 0 0  Feeling bad or failure about yourself  0 0 0  Trouble concentrating 1 0 0  Moving slowly or fidgety/restless 0 0 0  Suicidal thoughts 0 0 0  PHQ-9 Score 3 4 0     Review of Systems:  Pertinent items noted in HPI and remainder of comprehensive ROS otherwise negative.     Objective:     Physical Exam BP 115/77 (BP Location: Left Arm, Patient Position: Sitting, Cuff Size:  Large)   Pulse 71   Ht 5' 7 (1.702 m)   Wt 211 lb (95.7 kg)   LMP 01/24/2024 (Exact Date)   BMI 33.05 kg/m  Physical Exam Constitutional:      Appearance: Normal appearance.  Genitourinary:     Genitourinary Comments: Speculum Exam Deferred BME with enlarged uterus noted. Fibroid palpated in LUS. Uterine size c/w [redacted] week GA.   Breasts:    Right: No nipple discharge, skin change or tenderness.      Left: No nipple discharge, skin change or tenderness.  HENT:     Head: Normocephalic and atraumatic.  Eyes:     Conjunctiva/sclera: Conjunctivae normal.  Cardiovascular:     Rate and Rhythm: Normal rate.     Heart sounds: Normal heart sounds.  Pulmonary:     Effort: Pulmonary effort is normal. No respiratory distress.     Breath sounds: Normal breath sounds.  Abdominal:     General: Bowel sounds are normal.     Palpations: Abdomen is soft.     Tenderness: There is no abdominal tenderness.  Musculoskeletal:        General: Normal range of motion.     Cervical back: Normal range of motion.  Neurological:     Mental Status: She is alert and oriented to person, place, and time.  Skin:    General: Skin is warm and dry.  Psychiatric:        Mood and Affect: Mood normal.        Behavior: Behavior normal.  Vitals reviewed. Exam conducted with a chaperone present [place], [place]).      Labs and Imaging No results found for this or any previous visit (from the past week). No results found.    Assessment & Plan:  48 year old Female Well Woman Exam Uterine Fibroids  PeriMenopausal Sx Oral Contraception Pap UTD Mammogram UTD Colonoscopy UTD   1. Well woman exam with routine gynecological exam -Exam performed and findings discussed. -Educated on ASCCP guidelines regarding pap smear evaluation and frequency. -Plan for next pap in 2029. -Encouraged to activate and utilize Mychart for reviewing of results, communication with office, and scheduling of appts. -Educated on AHA exercise recommendations of 30 minutes of moderate to vigorous activity at least 5x/week.  2. Encounter for screening breast examination -CBE completed -Educated and encouraged to continue SBE with increased breast awareness including examination of breast for skin changes, moles, tenderness, etc.   3. Perimenopausal symptoms -Reviewed methods for improving symptoms. -Discussed increased  exercise. -Acknowledged patient desire for natural therapies.  Informed that provider will research further and send mychart message with notable findings.   4. Uterine leiomyoma, unspecified location -known history -Exam without significant changes from last year.  -Consider ultrasound if worsening of menstrual cycles.   5. Encounter for surveillance of contraceptive pills -Continue oral contraception. -Rx sent to pharmacy on file.     Routine preventative health maintenance measures emphasized. Please refer to After Visit Summary for other counseling recommendations.   Harlene LITTIE Duncans, CNM 02/04/2024

## 2024-02-06 MED ORDER — LEVONORGEST-ETH ESTRAD 91-DAY 0.15-0.03 MG PO TABS: 1.0000 | ORAL_TABLET | Freq: Every day | ORAL | 4 refills | Status: AC
